# Patient Record
Sex: Female | Born: 1935 | Race: White | Hispanic: No | Smoking: Never smoker
Health system: Southern US, Community
[De-identification: ages and names within clinical notes are randomized; demographics above are authoritative.]

## PROBLEM LIST (undated history)

## (undated) DIAGNOSIS — F329 Major depressive disorder, single episode, unspecified: Secondary | ICD-10-CM

## (undated) DIAGNOSIS — F0390 Unspecified dementia without behavioral disturbance: Secondary | ICD-10-CM

## (undated) DIAGNOSIS — F32A Depression, unspecified: Secondary | ICD-10-CM

## (undated) DIAGNOSIS — A539 Syphilis, unspecified: Secondary | ICD-10-CM

## (undated) HISTORY — PX: OTHER SURGICAL HISTORY: SHX169

---

## 2014-06-22 ENCOUNTER — Other Ambulatory Visit
Admission: RE | Admit: 2014-06-22 | Discharge: 2014-06-22 | Disposition: A | Discharge status: Home / Self Care | Payer: Medicare Other | Source: Ambulatory Visit | Attending: Family Medicine | Admitting: Family Medicine

## 2014-06-22 DIAGNOSIS — Z029 Encounter for administrative examinations, unspecified: Secondary | ICD-10-CM | POA: Insufficient documentation

## 2014-06-22 LAB — URINALYSIS COMPLETE WITH MICROSCOPIC (ARMC ONLY)
Bilirubin Urine: NEGATIVE
GLUCOSE, UA: NEGATIVE mg/dL
Hgb urine dipstick: NEGATIVE
Ketones, ur: NEGATIVE mg/dL
PH: 6.5 (ref 5.0–8.0)
Protein, ur: NEGATIVE mg/dL
Specific Gravity, Urine: 1.025 (ref 1.005–1.030)

## 2014-06-24 LAB — URINE CULTURE: Culture: >100000

## 2014-10-29 ENCOUNTER — Other Ambulatory Visit
Admission: RE | Admit: 2014-10-29 | Discharge: 2014-10-29 | Disposition: A | Discharge status: Home / Self Care | Payer: Medicare Other | Source: Ambulatory Visit | Attending: Nurse Practitioner | Admitting: Nurse Practitioner

## 2014-10-29 DIAGNOSIS — R410 Disorientation, unspecified: Secondary | ICD-10-CM | POA: Insufficient documentation

## 2014-10-29 LAB — URINALYSIS COMPLETE WITH MICROSCOPIC (ARMC ONLY)
Bilirubin Urine: NEGATIVE
Glucose, UA: NEGATIVE mg/dL
Ketones, ur: NEGATIVE mg/dL
NITRITE: NEGATIVE
PH: 6 (ref 5.0–8.0)
PROTEIN: NEGATIVE mg/dL
Specific Gravity, Urine: 1.015 (ref 1.005–1.030)

## 2014-10-31 LAB — URINE CULTURE: Culture: >=100000

## 2015-10-11 ENCOUNTER — Emergency Department
Admission: EM | Admit: 2015-10-11 | Discharge: 2015-10-12 | Disposition: A | Discharge status: Home / Self Care | Payer: Medicare Other | Attending: Emergency Medicine | Admitting: Emergency Medicine

## 2015-10-11 ENCOUNTER — Emergency Department: Payer: Medicare Other

## 2015-10-11 DIAGNOSIS — W19XXXA Unspecified fall, initial encounter: Secondary | ICD-10-CM

## 2015-10-11 DIAGNOSIS — Z043 Encounter for examination and observation following other accident: Secondary | ICD-10-CM | POA: Diagnosis present

## 2015-10-11 DIAGNOSIS — F0390 Unspecified dementia without behavioral disturbance: Secondary | ICD-10-CM | POA: Diagnosis not present

## 2015-10-11 DIAGNOSIS — Y929 Unspecified place or not applicable: Secondary | ICD-10-CM | POA: Diagnosis not present

## 2015-10-11 DIAGNOSIS — Y999 Unspecified external cause status: Secondary | ICD-10-CM | POA: Diagnosis not present

## 2015-10-11 DIAGNOSIS — X58XXXA Exposure to other specified factors, initial encounter: Secondary | ICD-10-CM | POA: Diagnosis not present

## 2015-10-11 DIAGNOSIS — Y939 Activity, unspecified: Secondary | ICD-10-CM | POA: Insufficient documentation

## 2015-10-11 HISTORY — DX: Unspecified dementia, unspecified severity, without behavioral disturbance, psychotic disturbance, mood disturbance, and anxiety: F03.90

## 2015-10-11 HISTORY — DX: Depression, unspecified: F32.A

## 2015-10-11 HISTORY — DX: Major depressive disorder, single episode, unspecified: F32.9

## 2015-10-11 LAB — URINALYSIS COMPLETE WITH MICROSCOPIC (ARMC ONLY)
Bacteria, UA: NONE SEEN
Bilirubin Urine: NEGATIVE
GLUCOSE, UA: NEGATIVE mg/dL
Leukocytes, UA: NEGATIVE
Nitrite: NEGATIVE
Protein, ur: NEGATIVE mg/dL
SPECIFIC GRAVITY, URINE: 1.012 (ref 1.005–1.030)
pH: 6 (ref 5.0–8.0)

## 2015-10-11 LAB — CBC WITH DIFFERENTIAL/PLATELET
BASOS ABS: 0.1 10*3/uL (ref 0–0.1)
Basophils Relative: 1 %
Eosinophils Absolute: 0.2 10*3/uL (ref 0–0.7)
Eosinophils Relative: 2 %
HEMATOCRIT: 36.3 % (ref 35.0–47.0)
HEMOGLOBIN: 12.5 g/dL (ref 12.0–16.0)
Lymphocytes Relative: 25 %
Lymphs Abs: 2.2 10*3/uL (ref 1.0–3.6)
MCH: 29.5 pg (ref 26.0–34.0)
MCHC: 34.3 g/dL (ref 32.0–36.0)
MCV: 86.1 fL (ref 80.0–100.0)
Monocytes Absolute: 0.8 10*3/uL (ref 0.2–0.9)
Monocytes Relative: 10 %
NEUTROS ABS: 5.4 10*3/uL (ref 1.4–6.5)
NEUTROS PCT: 62 %
Platelets: 274 10*3/uL (ref 150–440)
RBC: 4.22 MIL/uL (ref 3.80–5.20)
RDW: 14 % (ref 11.5–14.5)
WBC: 8.7 10*3/uL (ref 3.6–11.0)

## 2015-10-11 LAB — COMPREHENSIVE METABOLIC PANEL
ALK PHOS: 50 U/L (ref 38–126)
ALT: 17 U/L (ref 14–54)
ANION GAP: 7 (ref 5–15)
AST: 24 U/L (ref 15–41)
Albumin: 3.7 g/dL (ref 3.5–5.0)
BILIRUBIN TOTAL: 0.6 mg/dL (ref 0.3–1.2)
BUN: 16 mg/dL (ref 6–20)
CALCIUM: 8.8 mg/dL — AB (ref 8.9–10.3)
CO2: 29 mmol/L (ref 22–32)
Chloride: 102 mmol/L (ref 101–111)
Creatinine, Ser: 0.9 mg/dL (ref 0.44–1.00)
GFR calc non Af Amer: 53 mL/min — ABNORMAL LOW (ref >60)
Glucose, Bld: 106 mg/dL — ABNORMAL HIGH (ref 65–99)
Potassium: 3.6 mmol/L (ref 3.5–5.1)
Sodium: 138 mmol/L (ref 135–145)
TOTAL PROTEIN: 6.7 g/dL (ref 6.5–8.1)

## 2015-10-11 LAB — TROPONIN I: Troponin I: <0.03 ng/mL (ref <0.03)

## 2015-10-11 MED ORDER — SODIUM CHLORIDE 0.9 % IV BOLUS (SEPSIS): 500.0000 mL | Freq: Once | INTRAVENOUS | Status: DC

## 2015-10-11 NOTE — ED Notes (Signed)
EDP notified of being unable to get IV at this time.  Okay to not have IV at this time.

## 2015-10-11 NOTE — ED Provider Notes (Signed)
[facility] Emergency Department Provider Note  ____________________________________________   I have reviewed the triage vital signs and the nursing notes.   HISTORY  Chief Complaint Fall    HPI Marie Vega is a 82 y.o. female who presents today complaining of nothing.According to EMS, patient was found sitting on the ground. Since there was no witnessed fall was not clear how she got there. She is at her baseline otherwise. No further history is available. Level 5 chart caveat; no further history available due to patient status.     No past medical history on file.  There are no active problems to display for this patient.   No past surgical history on file.  Prior to Admission medications   Not on File    Allergies Review of patient's allergies indicates not on file.  No family history on file.  Social History Social History  Substance Use Topics  . Smoking status: Not on file  . Smokeless tobacco: Not on file  . Alcohol use Not on file    Review of Systems Level 5 chart caveat; no further history available due to patient status. ____________________________________________   PHYSICAL EXAM:  VITAL SIGNS: ED Triage Vitals [10/11/15 2145]  Enc Vitals Group     BP      Pulse      Resp      Temp 98.3 F (36.8 C)     Temp Source Oral     SpO2      Weight 105 lb (47.6 kg)     Height 5\' 4"  (1.626 m)     Head Circumference      Peak Flow      Pain Score      Pain Loc      Pain Edu?      Excl. in GC?     Constitutional: Alert and To name, only demented. Well appearing and in no acute distress. Eyes: Conjunctivae are normal. PERRL. EOMI. Head: Atraumatic. Nose: No congestion/rhinnorhea. Mouth/Throat: Mucous membranes are moist.  Oropharynx non-erythematous. Neck: No stridor.   Nontender with no meningismus Cardiovascular: Normal rate, regular rhythm. Grossly normal heart sounds.  Good peripheral circulation. Respiratory:  Normal respiratory effort.  No retractions. Lungs CTAB. Abdominal: Soft and nontender. No distention. No guarding no rebound Back:  There is no focal tenderness or step off.  there is no midline tenderness there are no lesions noted. there is no CVA tenderness Musculoskeletal: No lower extremity tenderness, no upper extremity tenderness. No joint effusions, no DVT signs strong distal pulses no edema Neurologic:  Normal speech and language. No gross focal neurologic deficits are appreciated.  Skin:  Skin is warm, dry and intact. No rash noted. Psychiatric: Mood and affect are normal. Speech and behavior are normal.  ____________________________________________   LABS (all labs ordered are listed, but only abnormal results are displayed)  Labs Reviewed  COMPREHENSIVE METABOLIC PANEL  CBC WITH DIFFERENTIAL/PLATELET  TROPONIN I  URINALYSIS COMPLETEWITH MICROSCOPIC ([facility] ONLY)   ____________________________________________  EKG  I personally interpreted any EKGs ordered by me or triage Sinus rhythm rate 62 bpm no acute ST elevation or acute ST depression borderline RAD, possible old anterior infarct no acute ischemia ____________________________________________  RADIOLOGY  I reviewed any imaging ordered by me or triage that were performed during my shift and, if possible, patient and/or family made aware of any abnormal findings. ____________________________________________   PROCEDURES  Procedure(s) performed: None  Procedures  Critical Care performed: None  ____________________________________________   INITIAL  IMPRESSION / ASSESSMENT AND PLAN / ED COURSE  Pertinent labs & imaging results that were available during my care of the patient were reviewed by me and considered in my medical decision making (see chart for details).  Patient here after possible fall either that or she was just sitting on the floor she can't give a history. No evidence of acute injury but given  that I cannot figure out what happened or she passed out we will do basic blood work urine and CT  Clinical Course   ____________________________________________   FINAL CLINICAL IMPRESSION(S) / ED DIAGNOSES  Final diagnoses:  None      This chart was dictated using voice recognition software.  Despite best efforts to proofread,  errors can occur which can change meaning.      Jeanmarie PlantJames A McShane, MD 10/11/15 2147

## 2015-10-11 NOTE — ED Triage Notes (Signed)
Pt from [facility], was found sitting on the floor by staff with unwitnessed fall.  Per EMS, pt's vitals WNL, pt having no complaints of pain.  Pt confused at baseline.  EMS states that staff reports some increased odd behavior by patient and are concerned for UTI.  EDP notified.

## 2015-10-13 ENCOUNTER — Observation Stay
Admission: EM | Admit: 2015-10-13 | Discharge: 2015-10-14 | Disposition: A | Discharge status: Home / Self Care | Payer: Medicare Other | Attending: Internal Medicine | Admitting: Internal Medicine

## 2015-10-13 ENCOUNTER — Emergency Department: Payer: Medicare Other

## 2015-10-13 ENCOUNTER — Encounter: Payer: Self-pay | Admitting: *Deleted

## 2015-10-13 DIAGNOSIS — F0390 Unspecified dementia without behavioral disturbance: Secondary | ICD-10-CM | POA: Insufficient documentation

## 2015-10-13 DIAGNOSIS — N390 Urinary tract infection, site not specified: Secondary | ICD-10-CM | POA: Diagnosis present

## 2015-10-13 DIAGNOSIS — I700 Atherosclerosis of aorta: Secondary | ICD-10-CM | POA: Insufficient documentation

## 2015-10-13 DIAGNOSIS — G9340 Encephalopathy, unspecified: Secondary | ICD-10-CM | POA: Diagnosis not present

## 2015-10-13 DIAGNOSIS — R531 Weakness: Secondary | ICD-10-CM | POA: Diagnosis not present

## 2015-10-13 DIAGNOSIS — Z8249 Family history of ischemic heart disease and other diseases of the circulatory system: Secondary | ICD-10-CM | POA: Diagnosis not present

## 2015-10-13 DIAGNOSIS — R4182 Altered mental status, unspecified: Secondary | ICD-10-CM | POA: Insufficient documentation

## 2015-10-13 DIAGNOSIS — Z79899 Other long term (current) drug therapy: Secondary | ICD-10-CM | POA: Diagnosis not present

## 2015-10-13 DIAGNOSIS — W19XXXA Unspecified fall, initial encounter: Secondary | ICD-10-CM | POA: Diagnosis not present

## 2015-10-13 DIAGNOSIS — R410 Disorientation, unspecified: Secondary | ICD-10-CM

## 2015-10-13 DIAGNOSIS — F329 Major depressive disorder, single episode, unspecified: Secondary | ICD-10-CM | POA: Diagnosis not present

## 2015-10-13 DIAGNOSIS — Z9842 Cataract extraction status, left eye: Secondary | ICD-10-CM | POA: Insufficient documentation

## 2015-10-13 DIAGNOSIS — I7389 Other specified peripheral vascular diseases: Secondary | ICD-10-CM | POA: Insufficient documentation

## 2015-10-13 DIAGNOSIS — R262 Difficulty in walking, not elsewhere classified: Secondary | ICD-10-CM | POA: Insufficient documentation

## 2015-10-13 DIAGNOSIS — Z7982 Long term (current) use of aspirin: Secondary | ICD-10-CM | POA: Insufficient documentation

## 2015-10-13 LAB — CBC WITH DIFFERENTIAL/PLATELET
BASOS PCT: 0 %
Basophils Absolute: 0 10*3/uL (ref 0–0.1)
EOS ABS: 0 10*3/uL (ref 0–0.7)
Eosinophils Relative: 1 %
HCT: 39.5 % (ref 35.0–47.0)
Hemoglobin: 13.4 g/dL (ref 12.0–16.0)
Lymphocytes Relative: 15 %
Lymphs Abs: 1.1 10*3/uL (ref 1.0–3.6)
MCH: 29.1 pg (ref 26.0–34.0)
MCHC: 33.9 g/dL (ref 32.0–36.0)
MCV: 86 fL (ref 80.0–100.0)
MONO ABS: 0.6 10*3/uL (ref 0.2–0.9)
Monocytes Relative: 8 %
NEUTROS ABS: 5.8 10*3/uL (ref 1.4–6.5)
Neutrophils Relative %: 76 %
Platelets: 247 10*3/uL (ref 150–440)
RBC: 4.59 MIL/uL (ref 3.80–5.20)
RDW: 14.2 % (ref 11.5–14.5)
WBC: 7.6 10*3/uL (ref 3.6–11.0)

## 2015-10-13 LAB — URINALYSIS COMPLETE WITH MICROSCOPIC (ARMC ONLY)
BACTERIA UA: NONE SEEN
Bilirubin Urine: NEGATIVE
Glucose, UA: NEGATIVE mg/dL
LEUKOCYTES UA: NEGATIVE
Nitrite: POSITIVE — AB
PH: 7 (ref 5.0–8.0)
Protein, ur: NEGATIVE mg/dL
Specific Gravity, Urine: 1.011 (ref 1.005–1.030)
Squamous Epithelial / LPF: NONE SEEN

## 2015-10-13 LAB — BASIC METABOLIC PANEL
ANION GAP: 9 (ref 5–15)
BUN: 14 mg/dL (ref 6–20)
CALCIUM: 9 mg/dL (ref 8.9–10.3)
CO2: 30 mmol/L (ref 22–32)
CREATININE: 0.72 mg/dL (ref 0.44–1.00)
Chloride: 101 mmol/L (ref 101–111)
Glucose, Bld: 117 mg/dL — ABNORMAL HIGH (ref 65–99)
Potassium: 3.3 mmol/L — ABNORMAL LOW (ref 3.5–5.1)
SODIUM: 140 mmol/L (ref 135–145)

## 2015-10-13 LAB — TROPONIN I

## 2015-10-13 MED ORDER — ONDANSETRON HCL 4 MG PO TABS: 4.0000 mg | ORAL_TABLET | Freq: Four times a day (QID) | ORAL | Status: DC | PRN

## 2015-10-13 MED ORDER — ASPIRIN 81 MG PO CHEW
CHEWABLE_TABLET | ORAL | Status: AC
  Administered 2015-10-13: 81 mg via ORAL
  Filled 2015-10-13: qty 1

## 2015-10-13 MED ORDER — CEPHALEXIN 500 MG PO CAPS
500.0000 mg | ORAL_CAPSULE | Freq: Once | ORAL | Status: AC
  Administered 2015-10-13: 500 mg via ORAL
  Filled 2015-10-13: qty 1

## 2015-10-13 MED ORDER — OXYCODONE HCL 5 MG PO TABS: 5.0000 mg | ORAL_TABLET | ORAL | Status: DC | PRN

## 2015-10-13 MED ORDER — SERTRALINE HCL 50 MG PO TABS: 50.0000 mg | ORAL_TABLET | Freq: Every day | ORAL | Status: DC

## 2015-10-13 MED ORDER — ACETAMINOPHEN 650 MG RE SUPP: 650.0000 mg | Freq: Four times a day (QID) | RECTAL | Status: DC | PRN

## 2015-10-13 MED ORDER — ACETAMINOPHEN 325 MG PO TABS: 650.0000 mg | ORAL_TABLET | Freq: Four times a day (QID) | ORAL | Status: DC | PRN

## 2015-10-13 MED ORDER — ASPIRIN 81 MG PO CHEW
81.0000 mg | CHEWABLE_TABLET | Freq: Every day | ORAL | Status: DC
  Administered 2015-10-13 – 2015-10-14 (×2): 81 mg via ORAL
  Filled 2015-10-13: qty 1

## 2015-10-13 MED ORDER — ONDANSETRON HCL 4 MG/2ML IJ SOLN: 4.0000 mg | Freq: Four times a day (QID) | INTRAMUSCULAR | Status: DC | PRN

## 2015-10-13 MED ORDER — ENOXAPARIN SODIUM 40 MG/0.4ML ~~LOC~~ SOLN
40.0000 mg | SUBCUTANEOUS | Status: DC
Start: 2015-10-13 — End: 2015-10-14
  Administered 2015-10-13: 40 mg via SUBCUTANEOUS
  Filled 2015-10-13: qty 0.4

## 2015-10-13 MED ORDER — DONEPEZIL HCL 5 MG PO TABS: 10.0000 mg | ORAL_TABLET | Freq: Every day | ORAL | Status: DC

## 2015-10-13 MED ORDER — HYDRALAZINE HCL 20 MG/ML IJ SOLN: 10.0000 mg | INTRAMUSCULAR | Status: DC | PRN

## 2015-10-13 NOTE — ED Provider Notes (Signed)
[facility] Emergency Department Provider Note  ____________________________________________  Time seen: Approximately 12:19 PM  I have reviewed the triage vital signs and the nursing notes.   HISTORY  Chief Complaint Altered Mental Status  Level 5 caveat:  Portions of the history and physical were unable to be obtained due to dementia and AMS   HPI Marie Vega is a 82 y.o. female a history of dementia and depression who presents for evaluation of altered mental status. Patient was seen here 2 days ago status post mechanical fall. At that time had labs, urine, CT head and cervical spine with no acute findings and was sent back to her skilled nursing facility. Today she was noticed to be sitting down and less responsive, she was confused which is not her baseline per staff.Staff reports the patient is usually very active and walks around the facility. They also noted she had slurred speech and he was leaning to the right which prompted them to send patient to the emergency room. Patient is very confused at this time and is alert and oriented to self only. She denies headache, chest pain, shortness of breath, abdominal pain. She is moving all 4 extremities. Her face is symmetric. Her vital signs are within normal limits.  Past Medical History:  Diagnosis Date  . Dementia   . Depression     Patient Active Problem List   Diagnosis Date Noted  . Acute encephalopathy 10/13/2015    History reviewed. No pertinent surgical history.  Prior to Admission medications   Medication Sig Start Date End Date Taking? Authorizing Provider  acetaminophen (TYLENOL) 500 MG tablet Take 500 mg by mouth at bedtime as needed.   Yes Historical Provider, MD  aspirin 81 MG chewable tablet Chew 81 mg by mouth daily.   Yes Historical Provider, MD  donepezil (ARICEPT) 10 MG tablet Take 10 mg by mouth at bedtime.   Yes Historical Provider, MD  Melatonin 3 MG TABS Take 3 mg by mouth at  bedtime.   Yes Historical Provider, MD  sertraline (ZOLOFT) 50 MG tablet Take 50 mg by mouth at bedtime.   Yes Historical Provider, MD    Allergies Review of patient's allergies indicates no known allergies.  Family History  Problem Relation Age of Onset  . Hypertension Other     Social History Social History  Substance Use Topics  . Smoking status: Never Smoker  . Smokeless tobacco: Never Used  . Alcohol use Not on file    Review of Systems  Constitutional: Negative for fever. +confusion Eyes: Negative for visual changes. ENT: Negative for sore throat. Cardiovascular: Negative for chest pain. Respiratory: Negative for shortness of breath. Gastrointestinal: Negative for abdominal pain, vomiting or diarrhea. Genitourinary: Negative for dysuria. Musculoskeletal: Negative for back pain. Skin: Negative for rash. Neurological: Negative for headaches, weakness or numbness.  ____________________________________________   PHYSICAL EXAM:  VITAL SIGNS: ED Triage Vitals [10/13/15 1202]  Enc Vitals Group     BP (!) 163/68     Pulse Rate 64     Resp 17     Temp 97.8 F (36.6 C)     Temp Source Oral     SpO2 98 %     Weight 105 lb (47.6 kg)     Height 5\' 4"  (1.626 m)     Head Circumference      Peak Flow      Pain Score      Pain Loc      Pain Edu?  Excl. in GC?     Constitutional: Alert and oriented x 1 only, slow to answer questions, no distress.  HEENT:      Head: Normocephalic and atraumatic.         Eyes: Conjunctivae are normal. Sclera is non-icteric. EOMI. PERRL      Mouth/Throat: Mucous membranes are dry.       Neck: Supple with no signs of meningismus. Cardiovascular: Regular rate and rhythm. No murmurs, gallops, or rubs. 2+ symmetrical distal pulses are present in all extremities. No JVD. Respiratory: Normal respiratory effort. Lungs are clear to auscultation bilaterally. No wheezes, crackles, or rhonchi.  Gastrointestinal: Soft, non tender, and non  distended with positive bowel sounds. No rebound or guarding. Genitourinary: No CVA tenderness. Musculoskeletal: Nontender with normal range of motion in all extremities. No edema, cyanosis, or erythema of extremities. Neurologic: Normal speech and language. Face is symmetric. Moving all extremities. No gross focal neurologic deficits are appreciated. Skin: Skin is warm, dry and intact. No rash noted. Psychiatric: Mood and affect are normal. Speech and behavior are normal.  ____________________________________________   LABS (all labs ordered are listed, but only abnormal results are displayed)  Labs Reviewed  URINALYSIS COMPLETEWITH MICROSCOPIC ([facility] ONLY) - Abnormal; Notable for the following:       Result Value   Color, Urine YELLOW (*)    APPearance HAZY (*)    Ketones, ur TRACE (*)    Hgb urine dipstick 1+ (*)    Nitrite POSITIVE (*)    All other components within normal limits  BASIC METABOLIC PANEL - Abnormal; Notable for the following:    Potassium 3.3 (*)    Glucose, Bld 117 (*)    All other components within normal limits  URINE CULTURE  CBC WITH DIFFERENTIAL/PLATELET  TROPONIN I   ____________________________________________  EKG  ED ECG REPORT I, Nita Sicklearolina Opie Fanton, the attending physician, personally viewed and interpreted this ECG.  Normal sinus rhythm, rate of 61, normal intervals, normal axis, no ST elevations or depressions. ____________________________________________  RADIOLOGY  Head CT: negative  CXR: negative  ____________________________________________   PROCEDURES  Procedure(s) performed: None Procedures Critical Care performed:  None ____________________________________________   INITIAL IMPRESSION / ASSESSMENT AND PLAN / ED COURSE  82 y.o. female a history of dementia and depression who presents for evaluation of altered mental status. Patient status post mechanical fall 2 days ago with essentially negative workup including imaging,  urine, and labs. Today was found to be less active, confused, slurring her speech, leaning to the right. Patient here is alert and oriented 1 and slowly answering to questions. She does not know where she is. She denies any complaints of pain at this time. She is grossly neurologically intact and is moving all 4 extremities with the face is symmetric, extraocular movements are intact. We'll check a repeat head CT, labs and urine.  Clinical Course  Comment By Time  UA pending. Patient remains altered. Labs, CT head, and CXR negative. Plan to admit for delirium. Care transferred to Dr. York CeriseForbach Nita Sicklearolina Loraina Stauffer, MD 08/23 1623    Pertinent labs & imaging results that were available during my care of the patient were reviewed by me and considered in my medical decision making (see chart for details).    ____________________________________________   FINAL CLINICAL IMPRESSION(S) / ED DIAGNOSES  Final diagnoses:  Delirium  UTI (lower urinary tract infection)      NEW MEDICATIONS STARTED DURING THIS VISIT:  Current Discharge Medication List  Note:  This document was prepared using Dragon voice recognition software and may include unintentional dictation errors.    Nita Sickle, MD 10/14/15 1026

## 2015-10-13 NOTE — H&P (Signed)
[employer] -  at [facility]   PATIENT NAME: Marie Vega    MR#:  [id number]  DATE OF BIRTH:  [birth year]   DATE OF ADMISSION:  10/13/2015  PRIMARY CARE PHYSICIAN: Randie Heinz, NP   REQUESTING/REFERRING PHYSICIAN: York Cerise  CHIEF COMPLAINT:   Chief Complaint  Patient presents with  . Altered Mental Status    HISTORY OF PRESENT ILLNESS:  Marie Vega  is a 82 y.o. female with a known history of Dementia who is presenting from her nursing facility with altered mental status. Patient unable to provide meaningful information history obtained from her department staff. Apparently she was more confused and sleepy this morning thus sent to Hospital further workup and evaluation. Of note recently evaluated hospital 2 days ago for the similar thing. Patient unable to provide meaningful information given mental status  PAST MEDICAL HISTORY:   Past Medical History:  Diagnosis Date  . Dementia   . Depression     PAST SURGICAL HISTORY:  History reviewed. No pertinent surgical history.  SOCIAL HISTORY:   Social History  Substance Use Topics  . Smoking status: Never Smoker  . Smokeless tobacco: Never Used  . Alcohol use Not on file    FAMILY HISTORY:   Family History  Problem Relation Age of Onset  . Hypertension Other     DRUG ALLERGIES:  No Known Allergies  REVIEW OF SYSTEMS:  REVIEW OF SYSTEMS: Pleasantly confused CONSTITUTIONAL: Denies fevers, chills, fatigue, weakness.  EYES: Denies blurred vision, double vision, or eye pain.  EARS, NOSE, THROAT: Denies tinnitus, ear pain, hearing loss.  RESPIRATORY: denies cough, shortness of breath, wheezing  CARDIOVASCULAR: Denies chest pain, palpitations, edema.  GASTROINTESTINAL: Denies nausea, vomiting, diarrhea, abdominal pain.  GENITOURINARY: Denies dysuria, hematuria.  ENDOCRINE: Denies nocturia or thyroid problems. HEMATOLOGIC AND LYMPHATIC: Denies easy bruising or bleeding.  SKIN: Denies  rash or lesions.  MUSCULOSKELETAL: Denies pain in neck, back, shoulder, knees, hips, or further arthritic symptoms.  NEUROLOGIC: Denies paralysis, paresthesias.  PSYCHIATRIC: Denies anxiety or depressive symptoms. Otherwise full review of systems performed by me is negative.   MEDICATIONS AT HOME:   Prior to Admission medications   Medication Sig Start Date End Date Taking? Authorizing Provider  acetaminophen (TYLENOL) 500 MG tablet Take 500 mg by mouth at bedtime as needed.   Yes Historical Provider, MD  aspirin 81 MG chewable tablet Chew 81 mg by mouth daily.   Yes Historical Provider, MD  donepezil (ARICEPT) 10 MG tablet Take 10 mg by mouth at bedtime.   Yes Historical Provider, MD  Melatonin 3 MG TABS Take 3 mg by mouth at bedtime.   Yes Historical Provider, MD  sertraline (ZOLOFT) 50 MG tablet Take 50 mg by mouth at bedtime.   Yes Historical Provider, MD      VITAL SIGNS:  Blood pressure (!) 172/60, pulse 93, temperature 97.8 F (36.6 C), temperature source Oral, resp. rate 16, height 5\' 4"  (1.626 m), weight 47.6 kg (105 lb), SpO2 96 %.  PHYSICAL EXAMINATION:  VITAL SIGNS: Vitals:   10/13/15 1202 10/13/15 1802  BP: (!) 163/68 (!) 172/60  Pulse: 64 93  Resp: 17 16  Temp: 97.8 F (36.6 C)    GENERAL:82 y.o.female currently in no acute distress. Pleasantly confused HEAD: Normocephalic, atraumatic.  EYES: Pupils equal, round, reactive to light. Extraocular muscles intact. No scleral icterus.  MOUTH: Moist mucosal membrane. Dentition intact. No abscess noted.  EAR, NOSE, THROAT: Clear without exudates. No external lesions.  NECK: Supple.  No thyromegaly. No nodules. No JVD.  PULMONARY: Clear to ascultation, without wheeze rails or rhonci. No use of accessory muscles, Good respiratory effort. good air entry bilaterally CHEST: Nontender to palpation.  CARDIOVASCULAR: S1 and S2. Regular rate and rhythm. No murmurs, rubs, or gallops. No edema. Pedal pulses 2+ bilaterally.    GASTROINTESTINAL: Soft, nontender, nondistended. No masses. Positive bowel sounds. No hepatosplenomegaly.  MUSCULOSKELETAL: No swelling, clubbing, or edema. Range of motion full in all extremities.  NEUROLOGIC: Cranial nerves II through XII are intact. No gross focal neurological deficits. Sensation intact. Reflexes intact.  SKIN: No ulceration, lesions, rashes, or cyanosis. Skin warm and dry. Turgor intact.  PSYCHIATRIC: Mood, affect within normal limits. The patient is awake, alert and oriented x self and location. Insight, judgment poor.    LABORATORY PANEL:   CBC  Recent Labs Lab 10/13/15 1236  WBC 7.6  HGB 13.4  HCT 39.5  PLT 247   ------------------------------------------------------------------------------------------------------------------  Chemistries   Recent Labs Lab 10/11/15 2137 10/13/15 1236  NA 138 140  K 3.6 3.3*  CL 102 101  CO2 29 30  GLUCOSE 106* 117*  BUN 16 14  CREATININE 0.90 0.72  CALCIUM 8.8* 9.0  AST 24  --   ALT 17  --   ALKPHOS 50  --   BILITOT 0.6  --    ------------------------------------------------------------------------------------------------------------------  Cardiac Enzymes  Recent Labs Lab 10/13/15 1236  TROPONINI <0.03   ------------------------------------------------------------------------------------------------------------------  RADIOLOGY:  Dg Chest 2 View  Result Date: 10/13/2015 CLINICAL DATA:  Altered mental status EXAM: CHEST  2 VIEW COMPARISON:  None. FINDINGS: There is no edema or consolidation. The heart size and pulmonary vascularity are normal. No adenopathy. There is atherosclerotic calcification in the aortic arch. There is degenerative change in the thoracic spine. IMPRESSION: Aortic atherosclerosis.  No edema or consolidation. Electronically Signed   By: Bretta BangWilliam  Woodruff III M.D.   On: 10/13/2015 13:40   Ct Head Wo Contrast  Result Date: 10/13/2015 CLINICAL DATA:  Slurred speech and gait  disturbance.  Confusion. EXAM: CT HEAD WITHOUT CONTRAST TECHNIQUE: Contiguous axial images were obtained from the base of the skull through the vertex without intravenous contrast. COMPARISON:  October 11, 2015 FINDINGS: Brain: Moderate diffuse atrophy is stable. There is no intracranial mass, hemorrhage, extra-axial fluid collection, or midline shift. There is patchy small vessel disease throughout the centra semiovale bilaterally, stable. No new gray-white compartment lesion. No acute infarct evident. Vascular: There are no hyperdense vessels. There is calcification in each carotid siphon region. Skull: Bony calvarium appears intact. Sinuses/Orbits: There is opacification in the inferior right frontal sinus region. There is opacification of a posterior right ethmoid air cell. Visualized orbits appear symmetric bilaterally. Other: Mastoid air cells are clear. IMPRESSION: Atrophy with periventricular small vessel disease, stable. No intracranial mass, hemorrhage, or acute appearing infarct. There are areas of paranasal sinus disease. There are foci of arterial vascular calcification. Electronically Signed   By: Bretta BangWilliam  Woodruff III M.D.   On: 10/13/2015 14:10   Ct Head Wo Contrast  Result Date: 10/11/2015 CLINICAL DATA:  Dementia patient post unwitnessed fall. Found sitting on floor. EXAM: CT HEAD WITHOUT CONTRAST CT CERVICAL SPINE WITHOUT CONTRAST TECHNIQUE: Multidetector CT imaging of the head and cervical spine was performed following the standard protocol without intravenous contrast. Multiplanar CT image reconstructions of the cervical spine were also generated. COMPARISON:  None. FINDINGS: CT HEAD FINDINGS Brain: Generalized atrophy and chronic small vessel ischemia.No intracranial hemorrhage, mass effect, or midline shift. No hydrocephalus. The  basilar cisterns are patent. No evidence of territorial infarct. No intracranial fluid collection. Vascular: No hyperdense vessel or abnormal calcification.  Atherosclerosis of skullbase vasculature. Skull: No acute fracture.  Calvarium is intact. Sinuses/Orbits: Remote appearing left nasal bone fracture without associated soft tissue edema. Included paranasal sinuses and mastoid air cells are well aerated. Cataract resection on the left. CT CERVICAL SPINE FINDINGS No fracture or acute subluxation. The dens is intact. There are no jumped or perched facets. Vertebral body heights are maintained. Disc space narrowing throughout most prominent at C5-C6 with associated endplate spurring. There is scattered facet arthropathy. Minimal anterolisthesis of C7 on T1 appears degenerative. No prevertebral soft tissue edema. Heterogeneous appearance of the thyroid gland. Pleural-parenchymal scarring at the lung apices. IMPRESSION: 1. No acute intracranial abnormality. Generalized atrophy and chronic small vessel ischemia. 2. Degenerative change in the cervical spine without acute fracture or subluxation. Electronically Signed   By: Rubye OaksMelanie  Ehinger M.D.   On: 10/11/2015 22:14   Ct Cervical Spine Wo Contrast  Result Date: 10/11/2015 CLINICAL DATA:  Dementia patient post unwitnessed fall. Found sitting on floor. EXAM: CT HEAD WITHOUT CONTRAST CT CERVICAL SPINE WITHOUT CONTRAST TECHNIQUE: Multidetector CT imaging of the head and cervical spine was performed following the standard protocol without intravenous contrast. Multiplanar CT image reconstructions of the cervical spine were also generated. COMPARISON:  None. FINDINGS: CT HEAD FINDINGS Brain: Generalized atrophy and chronic small vessel ischemia.No intracranial hemorrhage, mass effect, or midline shift. No hydrocephalus. The basilar cisterns are patent. No evidence of territorial infarct. No intracranial fluid collection. Vascular: No hyperdense vessel or abnormal calcification. Atherosclerosis of skullbase vasculature. Skull: No acute fracture.  Calvarium is intact. Sinuses/Orbits: Remote appearing left nasal bone fracture  without associated soft tissue edema. Included paranasal sinuses and mastoid air cells are well aerated. Cataract resection on the left. CT CERVICAL SPINE FINDINGS No fracture or acute subluxation. The dens is intact. There are no jumped or perched facets. Vertebral body heights are maintained. Disc space narrowing throughout most prominent at C5-C6 with associated endplate spurring. There is scattered facet arthropathy. Minimal anterolisthesis of C7 on T1 appears degenerative. No prevertebral soft tissue edema. Heterogeneous appearance of the thyroid gland. Pleural-parenchymal scarring at the lung apices. IMPRESSION: 1. No acute intracranial abnormality. Generalized atrophy and chronic small vessel ischemia. 2. Degenerative change in the cervical spine without acute fracture or subluxation. Electronically Signed   By: Rubye OaksMelanie  Ehinger M.D.   On: 10/11/2015 22:14    EKG:   Orders placed or performed during the hospital encounter of 10/13/15  . ED EKG  . ED EKG  . EKG 12-Lead  . EKG 12-Lead    IMPRESSION AND PLAN:   82 year old Caucasian female history of dementia presenting with worsening mental status  1. Encephalopathy: I actually question how far from baseline she is at this time, minimize sedating agents no indication for antibiotics at this time 2. Dementia Aricept    All the records are reviewed and case discussed with ED provider. Management plans discussed with the patient, family and they are in agreement.  CODE STATUS: Full  TOTAL TIME TAKING CARE OF THIS PATIENT: 33 minutes.    Hower,  Mardi MainlandDavid K M.D on 10/13/2015 at 7:32 PM  Between 7am to 6pm - Pager - [phone]  After 6pm: House Pager: [phone]  [facility]  Office  [phone]  CC: Primary care physician; Randie HeinzAnne Marie Mukamana, NP

## 2015-10-13 NOTE — ED Triage Notes (Signed)
Pt arrived to ED from [facility] via EMS after facility reports pt was slurring speech and leaning to the right. Pt was reportedly sitting this morning which is not normal for her. Per EMS pt is usually walking around and active. Pt was seen in ED 24 hours ago for a fall. Pt is confused and not responding to nurses questions at this time. Unknown baseline at this time.

## 2015-10-13 NOTE — ED Provider Notes (Signed)
-----------------------------------------   4:37 PM on 10/13/2015 -----------------------------------------   Blood pressure (!) 163/68, pulse 64, temperature 97.8 F (36.6 C), temperature source Oral, resp. rate 17, height 5\' 4"  (1.626 m), weight 47.6 kg, SpO2 98 %.  Assuming care from Dr. Don PerkingVeronese.  In short, Marie Vega is a 82 y.o. female with a chief complaint of Altered Mental Status .  Refer to the original H&P for additional details.  The current plan of care is to follow up urinalysis and admit for delirium.    ----------------------------------------- 6:55 PM on 10/13/2015 -----------------------------------------  Urine is nitrite positive but otherwise unremarkable.  Patient remains altered with signs and symptoms most consistent with delirium of unknown origin. Hospitalist will admit.   Loleta Roseory Jim Philemon, MD 10/13/15 [phone]

## 2015-10-14 DIAGNOSIS — G9340 Encephalopathy, unspecified: Secondary | ICD-10-CM | POA: Diagnosis not present

## 2015-10-14 LAB — MRSA PCR SCREENING: MRSA by PCR: NEGATIVE

## 2015-10-14 MED ORDER — ENSURE ENLIVE PO LIQD: 237.0000 mL | Freq: Three times a day (TID) | ORAL | 12 refills | Status: AC

## 2015-10-14 MED ORDER — POTASSIUM CHLORIDE 20 MEQ PO PACK
40.0000 meq | PACK | Freq: Once | ORAL | Status: AC
  Administered 2015-10-14: 40 meq via ORAL
  Filled 2015-10-14: qty 2

## 2015-10-14 MED ORDER — ENSURE ENLIVE PO LIQD
237.0000 mL | Freq: Three times a day (TID) | ORAL | Status: DC
  Administered 2015-10-14: 237 mL via ORAL

## 2015-10-14 NOTE — Progress Notes (Signed)
Initial Nutrition Assessment  DOCUMENTATION CODES:   Severe malnutrition in context of chronic illness  INTERVENTION:  -Ensure Enlive po BID, each supplement provides 350 kcal and 20 grams of protein -RD to continue monitor  NUTRITION DIAGNOSIS:   Malnutrition related to chronic illness as evidenced by severe depletion of muscle mass, moderate depletion of body fat.  GOAL:   Patient will meet greater than or equal to 90% of their needs  MONITOR:   PO intake, I & O's, Labs, Weight trends, Supplement acceptance  ASSESSMENT:   Marie PellegriniMary Vega  is a 82 y.o. female with a known history of Dementia who is presenting from her nursing facility with altered mental status. Patient unable to provide meaningful information history obtained from her department staff. Apparently she was more confused and sleepy this morning thus sent to Hospital further workup and evaluation. Of note recently evaluated hospital 2 days ago for the similar thing.  Attempted to speak with Marie Vega at bedside but she is a poor historian who remembers very little. She does state her PO intake PTA and Appetite have been poor. States a normal wt of 109# otherwise, she did not provide much information. Per sitter, Nurse Tech, she did not eat any breakfast.  Nutrition-Focused physical exam completed. Findings are moderate fat depletion, severe muscle depletion, and no edema.   Labs and medications reviewed  Diet Order:  Diet Heart Room service appropriate? Yes; Fluid consistency: Thin  Skin:  Reviewed, no issues  Last BM:  PTA  Height:   Ht Readings from Last 1 Encounters:  10/13/15 5\' 4"  (1.626 m)    Weight:   Wt Readings from Last 1 Encounters:  10/13/15 105 lb (47.6 kg)    Ideal Body Weight:  54.54 kg  BMI:  Body mass index is 18.02 kg/m.  Estimated Nutritional Needs:   Kcal:  1200-1400 calories  Protein:  47-60 grams  Fluid:  >/= 1.2L  EDUCATION NEEDS:   No education needs identified at this  time  Dionne AnoWilliam M. Daruis Swaim, MS, RD LDN Inpatient Clinical Dietitian Pager [phone]

## 2015-10-14 NOTE — Care Management Obs Status (Signed)
MEDICARE OBSERVATION STATUS NOTIFICATION   Patient Details  Name: Marie Vega MRN: [id number] Date of Birth: 04/29/1919   Medicare Observation Status Notification Given:  Yes    Marie HugueninBerkhead, Rilei Kravitz L, RN 10/14/2015, 2:18 PM

## 2015-10-14 NOTE — Evaluation (Signed)
Physical Therapy Evaluation Patient Details Name: Marie Vega MRN: [id number] DOB: 1919/10/30 Today's Date: 10/14/2015   History of Present Illness  Marie Vega is a 82 y/o female who is being held for observation due to a change in mental status noticed on 8/23 when the pt has slurred speech and was leaning to her right. Her CT was neg for stroke; pt was at the ED 24h before this occation on 8/21 for an unobserved fall with odd behavior. The pt is confused at her baseline with a Hx of dementia  Clinical Impression  Marie Vega is a pleasantly confused 82 y/o female. She presents with general weakness, poor safety awareness, and impaired balance. She is oriented to self and birthday, but not time or place. She is independent with bed mobility, but requires increased time/ effort; with initial sit-to-stand pt required mod assist due to feet being too far foward with inconsistent response to cues and poor balance; in standing pt continued to have a moderate posterior lean. Pt initially had a moderate posterior lean witout AD requiring assist X 2 to keep from falling; within 1-107minutes pt was given RW and was able to stand upright w/o posterior lean; she received gait training X 60' with RW and min assist X 1. At her baseline pt is independent with functional mobility without AD. Pt is appropriate for skilled PT at this time to address deficits in strength, balance, coordination, endurance, gait, safety awareness, and safe use of DME. This entire session was guided, instructed, and directly supervised by Elizabeth Palau, DPT.      Follow Up Recommendations Home health PT    Equipment Recommendations  Rolling walker with 5" wheels (unknown if pt has RW already)    Recommendations for Other Services       Precautions / Restrictions Precautions Precautions: Fall Precaution Comments: Strong posterior lean, better with RW      Mobility  Bed Mobility Overal bed mobility: Needs Assistance Bed Mobility:  Supine to Sit;Sit to Supine     Supine to sit: Supervision Sit to supine: Supervision   General bed mobility comments: Increased time and effort, with inconsistant safety awareness.   Transfers Overall transfer level: Needs assistance Equipment used: None Transfers: Sit to/from Stand Sit to Stand: Mod assist         General transfer comment: Increased time and effort, with inconsistant safety awareness. With initial sit-to-stand pt required mod assist due to feet being too far foward with poor balance; in standing pt continued to have a moderate posterior lean  Ambulation/Gait Ambulation/Gait assistance: Min assist Ambulation Distance (Feet): 60 Feet Assistive device: Rolling walker (2 wheeled) Gait Pattern/deviations: Shuffle;Drifts right/left   Gait velocity interpretation: <1.8 ft/sec, indicative of risk for recurrent falls General Gait Details: Pt initially had a moderate posterior lean witout AD requiring assist X 2 to keep from falling; within 1-29minutes pt was given RW and was able to stand upright w/o posterior lean; she received gait training X 60' with min assist  Stairs            Wheelchair Mobility    Modified Rankin (Stroke Patients Only)       Balance Overall balance assessment: Needs assistance Sitting-balance support: No upper extremity supported;Feet supported Sitting balance-Leahy Scale: Good Sitting balance - Comments: pt can tolerate minor perturbation Postural control: Posterior lean (resolved within minutes with cuing and RW) Standing balance support: Bilateral upper extremity supported Standing balance-Leahy Scale: Fair Standing balance comment: balance was fair once pt resolved posterior  lean (1-2 minutes after standing)                             Pertinent Vitals/Pain Pain Assessment: No/denies pain    Home Living Family/patient expects to be discharged to:: Assisted living               Home Equipment:   (Unknown)      Prior Function Level of Independence: Needs assistance   Gait / Transfers Assistance Needed: Pt was independent without AD  ADL's / Homemaking Assistance Needed: Pt is too confused to safely care for herself, but exact level of care is unknown        Hand Dominance        Extremity/Trunk Assessment   Upper Extremity Assessment: Generalized weakness (4-/5 with gross MMT)           Lower Extremity Assessment: Generalized weakness (4-/5 with gross MMT)      Cervical / Trunk Assessment: Kyphotic  Communication   Communication: No difficulties  Cognition Arousal/Alertness: Awake/alert Behavior During Therapy: WFL for tasks assessed/performed Overall Cognitive Status: History of cognitive impairments - at baseline       Memory: Decreased short-term memory              General Comments      Exercises Other Exercises Other Exercises: Gait training with RW after pt stood with moderate posterior lean requiring mod assist X 2. Posterior lean was resolved with verbal and tactile cuing and use of RW. Gait training included cuing for posture, hand and foot placement, and gait mechanics.      Assessment/Plan    PT Assessment Patient needs continued PT services  PT Diagnosis Difficulty walking;Abnormality of gait;Generalized weakness   PT Problem List Decreased strength;Decreased range of motion;Decreased activity tolerance;Decreased mobility;Decreased balance;Decreased coordination;Decreased cognition;Decreased knowledge of use of DME;Decreased safety awareness;Decreased knowledge of precautions;Cardiopulmonary status limiting activity  PT Treatment Interventions DME instruction;Gait training;Stair training;Functional mobility training;Therapeutic activities;Therapeutic exercise;Balance training;Neuromuscular re-education;Cognitive remediation   PT Goals (Current goals can be found in the Care Plan section) Acute Rehab PT Goals Patient Stated Goal: none  stated PT Goal Formulation: With patient Time For Goal Achievement: 10/28/15 Potential to Achieve Goals: Fair    Frequency Min 2X/week   Barriers to discharge        Co-evaluation               End of Session Equipment Utilized During Treatment: Gait belt Activity Tolerance: Patient tolerated treatment well Patient left: in bed;with call bell/phone within reach;with bed alarm set Nurse Communication: Mobility status         Time: 2956-21301402-1428 PT Time Calculation (min) (ACUTE ONLY): 26 min   Charges:         PT G Codes:        Graden Hoshino 10/14/2015, 3:33 PM  Cassell Smilesevan M Ilham Roughton, SPT [phone]

## 2015-10-14 NOTE — [place] FL2 (Signed)
[place] MEDICAID FL2 LEVEL OF CARE SCREENING TOOL     IDENTIFICATION  Patient Name: Marie PellegriniMary Vega Birthdate: 03/25/1919 Sex: female Admission Date (Current Location): 10/13/2015  [place]   Facility and Address:  [facility], [address], [place], [place] [address]      Provider Number: [id number]  Attending Physician Name and Address:  Altamese DillingVaibhavkumar Rehana Uncapher, MD  Relative Name and Phone Number:       Current Level of Care: Domiciliary (Rest home) Recommended Level of Care: Memory Care, Assisted Living Facility Prior Approval Number:    Date Approved/Denied:   PASRR Number:    Discharge Plan: Domiciliary (Rest home)    Current Diagnoses: Primary: Dementia  Patient Active Problem List   Diagnosis Date Noted  . Acute encephalopathy 10/13/2015  Depression  Altered Mental Status     Orientation RESPIRATION BLADDER Height & Weight     Self  Normal Incontinent Weight: 105 lb (47.6 kg) Height:  5\' 4"  (162.6 cm)  BEHAVIORAL SYMPTOMS/MOOD NEUROLOGICAL BOWEL NUTRITION STATUS   (none)  (none ) Continent Diet (Diet: Heart Healthy )  AMBULATORY STATUS COMMUNICATION OF NEEDS Skin   Supervision Verbally Normal                       Personal Care Assistance Level of Assistance  Bathing, Feeding, Dressing Bathing Assistance: Limited assistance Feeding assistance: Independent Dressing Assistance: Limited assistance     Functional Limitations Info  Sight, Hearing, Speech Sight Info: Adequate Hearing Info: Adequate Speech Info: Adequate    SPECIAL CARE FACTORS FREQUENCY  PT (By licensed PT)     PT Frequency:  (home health PT 2-3 days per week. )              Contractures      Additional Factors Info  Code Status, Allergies Code Status Info:  (Full Code. ) Allergies Info:  (No Known Allergies. )           Current Medications (10/14/2015):  This is the current hospital active medication  list Current Facility-Administered Medications  Medication Dose Route Frequency Provider Last Rate Last Dose  . acetaminophen (TYLENOL) tablet 650 mg  650 mg Oral Q6H PRN Wyatt Hasteavid K Hower, MD       Or  . acetaminophen (TYLENOL) suppository 650 mg  650 mg Rectal Q6H PRN Wyatt Hasteavid K Hower, MD      . aspirin chewable tablet 81 mg  81 mg Oral Daily Wyatt Hasteavid K Hower, MD   81 mg at 10/13/15 2109  . donepezil (ARICEPT) tablet 10 mg  10 mg Oral QHS Wyatt Hasteavid K Hower, MD      . enoxaparin (LOVENOX) injection 40 mg  40 mg Subcutaneous Q24H Wyatt Hasteavid K Hower, MD   40 mg at 10/13/15 2231  . hydrALAZINE (APRESOLINE) injection 10 mg  10 mg Intravenous Q4H PRN Wyatt Hasteavid K Hower, MD      . ondansetron [facility](ZOFRAN) tablet 4 mg  4 mg Oral Q6H PRN Wyatt Hasteavid K Hower, MD       Or  . ondansetron [facility](ZOFRAN) injection 4 mg  4 mg Intravenous Q6H PRN Wyatt Hasteavid K Hower, MD      . oxyCODONE (Oxy IR/ROXICODONE) immediate release tablet 5 mg  5 mg Oral Q4H PRN Wyatt Hasteavid K Hower, MD      . sertraline (ZOLOFT) tablet 50 mg  50 mg Oral QHS Wyatt Hasteavid K Hower, MD         Discharge Medications: Please  see discharge summary for a list of discharge medications.  Relevant Imaging Results:  Relevant Lab Results:   Additional Information  (SSN: [id number])  Sample, Darleen CrockerBailey M, LCSW

## 2015-10-14 NOTE — Clinical Social Work Note (Signed)
Clinical Social Work Assessment  Patient Details  Name: Marie PellegriniMary Huffaker MRN: [id number] Date of Birth: 09/16/1919  Date of referral:  10/14/15               Reason for consult:  Other (Comment Required) (From [facility] )                Permission sought to share information with:  Ocean[place]ility Contact Representative Permission granted to share information::  Yes, Verbal Permission Granted  Name::      [facility]   Agency::     Relationship::     Contact Information:     Housing/Transportation Living arrangements for the past 2 months:  Assisted Living Facility Source of Information:  Facility, Adult Children, Power of Attorney Patient Interpreter Needed:  None Criminal Activity/Legal Involvement Pertinent to Current Situation/Hospitalization:  No - Comment as needed Significant Relationships:  Adult Children Lives with:  Facility Resident Do you feel safe going back to the place where you live?    Need for family participation in patient care:  Yes (Comment)  Care giving concerns:  Patient is a long term care resident at [facility] (fax: [phone])   Social Worker assessment / plan:  Clinical Social Worker (CSW) reviewed chart and noted that patient is from [facility]. CSW contacted [facility] and spoke to [employer]/ Supervisor Jennifer. Per Victorino DikeJennifer patient is a resident in the memory care unit and at baseline walks independently without an assistive device. Per Victorino DikeJennifer patient's son Jomarie LongsJoseph is the main contact. Per Victorino DikeJennifer patient can return and they have in house home health if she needs it. CSW also contacted patient's son Jomarie LongsJoseph to complete assessment because patient is not alert and oriented. Per son patient has lived at [facility] since November 2016 and is private pay. Per son he is HPOA and will transport patient back to [facility] when she is ready for D/C. CSW explained to son that patient is under  Medicare Observation, which means that Medicare will not pay for a SNF. Son verbalized his understanding and is agreeable to patient returning to [facility].   FL2 complete. CSW requested PT consult to determine if patient is at her baseline. CSW will continue to follow and assist as needed.   Employment status:  Retired Health and safety inspectornsurance information:  Medicare PT Recommendations:  Not assessed at this time Information / Referral to community resources:  Other (Comment Required) (Plan is to return to ALF )  Patient/Family's Response to care:  Patient's son is agreeable for patient to return to [facility].   Patient/Family's Understanding of and Emotional Response to Diagnosis, Current Treatment, and Prognosis:  Patient's son was very pleasant and thanked CSW for assistance.   Emotional Assessment Appearance:  Appears stated age Attitude/Demeanor/Rapport:  Unable to Assess Affect (typically observed):  Unable to Assess Orientation:  Oriented to Self, Fluctuating Orientation (Suspected and/or reported Sundowners) Alcohol / Substance use:  Not Applicable Psych involvement (Current and /or in the community):  No (Comment)  Discharge Needs  Concerns to be addressed:  Discharge Planning Concerns Readmission within the last 30 days:  No Current discharge risk:  Cognitively Impaired, Chronically ill Barriers to Discharge:  Continued Medical Work up   [employer], Darleen CrockerBailey M, LCSW 10/14/2015, 9:52 AM

## 2015-10-14 NOTE — Discharge Summary (Signed)
[facility] -  at [facility]   PATIENT NAME: Marie PellegriniMary Vega    MR#:  [id number]  DATE OF BIRTH:  10/10/1919  DATE OF ADMISSION:  10/13/2015 ADMITTING PHYSICIAN: Wyatt Hasteavid K Hower, MD  DATE OF DISCHARGE: 10/14/2015  PRIMARY CARE PHYSICIAN: Randie HeinzAnne Marie Mukamana, NP    ADMISSION DIAGNOSIS:  Delirium [R41.0] UTI (lower urinary tract infection) [N39.0]  DISCHARGE DIAGNOSIS:  Active Problems:   Acute encephalopathy   SECONDARY DIAGNOSIS:   Past Medical History:  Diagnosis Date  . Dementia   . Depression     HOSPITAL COURSE:   * Encephalopathy- due to sedating meds.   No signs of infections found.  Pt was stable and walked with PT.  DISCHARGE CONDITIONS:   Stable.  CONSULTS OBTAINED:  Treatment Team:  Wyatt Hasteavid K Hower, MD  DRUG ALLERGIES:  No Known Allergies  DISCHARGE MEDICATIONS:   Current Discharge Medication List    START taking these medications   Details  feeding supplement, ENSURE ENLIVE, (ENSURE ENLIVE) LIQD Take 237 mLs by mouth 3 (three) times daily between meals. Qty: 237 mL, Refills: 12      CONTINUE these medications which have NOT CHANGED   Details  acetaminophen (TYLENOL) 500 MG tablet Take 500 mg by mouth at bedtime as needed.    aspirin 81 MG chewable tablet Chew 81 mg by mouth daily.    donepezil (ARICEPT) 10 MG tablet Take 10 mg by mouth at bedtime.    sertraline (ZOLOFT) 50 MG tablet Take 50 mg by mouth at bedtime.      STOP taking these medications     Melatonin 3 MG TABS          DISCHARGE INSTRUCTIONS:    Follow with PMD in 2 weeks.  If you experience worsening of your admission symptoms, develop shortness of breath, life threatening emergency, suicidal or homicidal thoughts you must seek medical attention immediately by calling 911 or calling your MD immediately  if symptoms less severe.  You Must read complete instructions/literature along with all the possible adverse reactions/side effects for  all the Medicines you take and that have been prescribed to you. Take any new Medicines after you have completely understood and accept all the possible adverse reactions/side effects.   Please note  You were cared for by a hospitalist during your hospital stay. If you have any questions about your discharge medications or the care you received while you were in the hospital after you are discharged, you can call the unit and asked to speak with the hospitalist on call if the hospitalist that took care of you is not available. Once you are discharged, your primary care physician will handle any further medical issues. Please note that NO REFILLS for any discharge medications will be authorized once you are discharged, as it is imperative that you return to your primary care physician (or establish a relationship with a primary care physician if you do not have one) for your aftercare needs so that they can reassess your need for medications and monitor your lab values.    Today   CHIEF COMPLAINT:   Chief Complaint  Patient presents with  . Altered Mental Status    HISTORY OF PRESENT ILLNESS:  Marie PellegriniMary Vega  is a 82 y.o. female with a known history of Dementia who is presenting from her nursing facility with altered mental status. Patient unable to provide meaningful information history obtained from her department staff. Apparently she was more confused and sleepy this  morning thus sent to Hospital further workup and evaluation. Of note recently evaluated hospital 2 days ago for the similar thing. Patient unable to provide meaningful information given mental status  VITAL SIGNS:  Blood pressure (!) 181/62, pulse 66, temperature 98.5 F (36.9 C), temperature source Oral, resp. rate 18, height 5\' 4"  (1.626 m), weight 47.6 kg (105 lb), SpO2 98 %.  I/O:   Intake/Output Summary (Last 24 hours) at 10/14/15 1550 Last data filed at 10/14/15 0900  Gross per 24 hour  Intake                0 ml   Output                0 ml  Net                0 ml    PHYSICAL EXAMINATION:   GENERAL:82 y.o.female currently in no acute distress. Pleasantly confused HEAD: Normocephalic, atraumatic.  EYES: Pupils equal, round, reactive to light. Extraocular muscles intact. No scleral icterus.  MOUTH: Moist mucosal membrane. Dentition intact. No abscess noted.  EAR, NOSE, THROAT: Clear without exudates. No external lesions.  NECK: Supple. No thyromegaly. No nodules. No JVD.  PULMONARY: Clear to ascultation, without wheeze rails or rhonci. No use of accessory muscles, Good respiratory effort. good air entry bilaterally CHEST: Nontender to palpation.  CARDIOVASCULAR: S1 and S2. Regular rate and rhythm. No murmurs, rubs, or gallops. No edema. Pedal pulses 2+ bilaterally.  GASTROINTESTINAL: Soft, nontender, nondistended. No masses. Positive bowel sounds. No hepatosplenomegaly.  MUSCULOSKELETAL: No swelling, clubbing, or edema. Range of motion full in all extremities.  NEUROLOGIC: Cranial nerves II through XII are intact. No gross focal neurological deficits. Sensation intact. Reflexes intact.  SKIN: No ulceration, lesions, rashes, or cyanosis. Skin warm and dry. Turgor intact.  PSYCHIATRIC: Mood, affect within normal limits. The patient is awake, alert and oriented x self and location. Insight, judgment poor.   DATA REVIEW:   CBC  Recent Labs Lab 10/13/15 1236  WBC 7.6  HGB 13.4  HCT 39.5  PLT 247    Chemistries   Recent Labs Lab 10/11/15 2137 10/13/15 1236  NA 138 140  K 3.6 3.3*  CL 102 101  CO2 29 30  GLUCOSE 106* 117*  BUN 16 14  CREATININE 0.90 0.72  CALCIUM 8.8* 9.0  AST 24  --   ALT 17  --   ALKPHOS 50  --   BILITOT 0.6  --     Cardiac Enzymes  Recent Labs Lab 10/13/15 1236  TROPONINI <0.03    Microbiology Results  Results for orders placed or performed during the hospital encounter of 10/13/15  MRSA PCR Screening     Status: None   Collection Time: 10/14/15   1:46 PM  Result Value Ref Range Status   MRSA by PCR NEGATIVE NEGATIVE Final    Comment:        The GeneXpert MRSA Assay (FDA approved for NASAL specimens only), is one component of a comprehensive MRSA colonization surveillance program. It is not intended to diagnose MRSA infection nor to guide or monitor treatment for MRSA infections.     RADIOLOGY:  Dg Chest 2 View  Result Date: 10/13/2015 CLINICAL DATA:  Altered mental status EXAM: CHEST  2 VIEW COMPARISON:  None. FINDINGS: There is no edema or consolidation. The heart size and pulmonary vascularity are normal. No adenopathy. There is atherosclerotic calcification in the aortic arch. There is degenerative change in the thoracic spine.  IMPRESSION: Aortic atherosclerosis.  No edema or consolidation. Electronically Signed   By: Bretta Bang III M.D.   On: 10/13/2015 13:40   Ct Head Wo Contrast  Result Date: 10/13/2015 CLINICAL DATA:  Slurred speech and gait disturbance.  Confusion. EXAM: CT HEAD WITHOUT CONTRAST TECHNIQUE: Contiguous axial images were obtained from the base of the skull through the vertex without intravenous contrast. COMPARISON:  October 11, 2015 FINDINGS: Brain: Moderate diffuse atrophy is stable. There is no intracranial mass, hemorrhage, extra-axial fluid collection, or midline shift. There is patchy small vessel disease throughout the centra semiovale bilaterally, stable. No new gray-white compartment lesion. No acute infarct evident. Vascular: There are no hyperdense vessels. There is calcification in each carotid siphon region. Skull: Bony calvarium appears intact. Sinuses/Orbits: There is opacification in the inferior right frontal sinus region. There is opacification of a posterior right ethmoid air cell. Visualized orbits appear symmetric bilaterally. Other: Mastoid air cells are clear. IMPRESSION: Atrophy with periventricular small vessel disease, stable. No intracranial mass, hemorrhage, or acute appearing  infarct. There are areas of paranasal sinus disease. There are foci of arterial vascular calcification. Electronically Signed   By: Bretta Bang III M.D.   On: 10/13/2015 14:10    EKG:   Orders placed or performed during the hospital encounter of 10/13/15  . ED EKG  . ED EKG  . EKG 12-Lead  . EKG 12-Lead      Management plans discussed with the patient, family and they are in agreement.  CODE STATUS:     Code Status Orders        Start     Ordered   10/13/15 1856  Full code  Continuous     10/13/15 1855    Code Status History    Date Active Date Inactive Code Status Order ID Comments User Context   This patient has a current code status but no historical code status.      TOTAL TIME TAKING CARE OF THIS PATIENT: 35 minutes.    Altamese Dilling M.D on 10/14/2015 at 3:50 PM  Between 7am to 6pm - Pager - [phone]  After 6pm go to [email or web address] - Social research officer, government  [facility]  Office  [phone]  CC: Primary care physician; Randie Heinz, NP   Note: This dictation was prepared with Dragon dictation along with smaller phrase technology. Any transcriptional errors that result from this process are unintentional.

## 2015-10-14 NOTE — NC FL2 (Signed)
  [place] MEDICAID FL2 LEVEL OF CARE SCREENING TOOL     IDENTIFICATION  Patient Name: Marie PellegriniMary Vega Birthdate: 10/29/1919 Sex: female Admission Date (Current Location): 10/13/2015  [place]   Facility and Address:  [facility], [address], [place], [place] [address]      Provider Number: [id number]  Attending Physician Name and Address:  Altamese DillingVaibhavkumar Baily Hovanec, MD  Relative Name and Phone Number:       Current Level of Care: Domiciliary (Rest home) Recommended Level of Care: Memory Care, Assisted Living Facility Prior Approval Number:    Date Approved/Denied:   PASRR Number:    Discharge Plan: Domiciliary (Rest home)    Current Diagnoses: Primary: Dementia  Patient Active Problem List   Diagnosis Date Noted  . Acute encephalopathy 10/13/2015  UTI (lower urinary tract infection) [N39.0]  Orientation RESPIRATION BLADDER Height & Weight     Self  Normal Incontinent Weight: 105 lb (47.6 kg) Height:  5\' 4"  (162.6 cm)  BEHAVIORAL SYMPTOMS/MOOD NEUROLOGICAL BOWEL NUTRITION STATUS   (none)  (none ) Continent Diet (Diet: Heart Healthy )  AMBULATORY STATUS COMMUNICATION OF NEEDS Skin   Supervision Verbally Normal                       Personal Care Assistance Level of Assistance  Bathing, Feeding, Dressing Bathing Assistance: Limited assistance Feeding assistance: Independent Dressing Assistance: Limited assistance     Functional Limitations Info  Sight, Hearing, Speech Sight Info: Adequate Hearing Info: Adequate Speech Info: Adequate    SPECIAL CARE FACTORS FREQUENCY  PT (By licensed PT)     PT Frequency:  (home health PT 2-3 days per week. )              Contractures      Additional Factors Info  Code Status, Allergies Code Status Info:  (Full Code. ) Allergies Info:  (No Known Allergies. )          Discharge Medications: Please see discharge summary for a list of discharge  medications. DISCHARGE MEDICATIONS:       Current Discharge Medication List        START taking these medications   Details  feeding supplement, ENSURE ENLIVE, (ENSURE ENLIVE) LIQD Take 237 mLs by mouth 3 (three) times daily between meals. Qty: 237 mL, Refills: 12          CONTINUE these medications which have NOT CHANGED   Details  acetaminophen (TYLENOL) 500 MG tablet Take 500 mg by mouth at bedtime as needed.    aspirin 81 MG chewable tablet Chew 81 mg by mouth daily.    donepezil (ARICEPT) 10 MG tablet Take 10 mg by mouth at bedtime.    sertraline (ZOLOFT) 50 MG tablet Take 50 mg by mouth at bedtime.         STOP taking these medications     Melatonin 3 MG TABS     Relevant Imaging Results: Relevant Lab Results: Additional Information  (SSN: [id number])  Sample, Marie CrockerBailey M, LCSW

## 2015-10-14 NOTE — Progress Notes (Addendum)
Pt being discharged back to ALF, report called to facility. Discharged packet will be provided to pt son who will transport pt back to facility. She will be leaving with all her belongings, including walker.

## 2015-10-14 NOTE — Care Management Note (Signed)
Case Management Note  Patient Details  Name: Marie Vega MRN: [id number] Date of Birth: 11/27/1919  Subjective/Objective:                    Action/Plan: anticipated discharge plan is [facility] ... DME orders placed for rolling walker.  CSW following for placement.  Expected Discharge Date:                  Expected Discharge Plan:  Assisted Living / Rest Home  In-House Referral:     Discharge planning Services  CM Consult  Post Acute Care Choice:    Choice offered to:     DME Arranged:    DME Agency:     HH Arranged:    HH Agency:     Status of Service:  In process, will continue to follow  If discussed at Long Length of Stay Meetings, dates discussed:    Additional Comments:  Adonis HugueninBerkhead, Alamin Mccuiston L, RN 10/14/2015, 3:11 PM

## 2015-10-14 NOTE — Progress Notes (Signed)
Patient is medically stable for D/C back to [facility]. PT is recommending home health and a walker. RN Case Manager arranged for a walker to be delivered to patient's room. Per Samantha CrimesJessica Social Worker at [facility] patient can return today since she is at baseline. Clinical Child psychotherapistocial Worker (CSW) prepared D/C packet and sent D/C orders to [facility]. CSW contacted patient's daughter in law [employer] and made her aware of above. Per daughter in law they will pick patient up today at 5:30 this afternoon. RN called report and is aware of above. Please reconsult if future social work needs arise. CSW signing off.   Baker Hughes IncorporatedBailey Irven Ingalsbe, LCSW [phone]

## 2015-10-15 ENCOUNTER — Encounter: Payer: Self-pay | Admitting: Emergency Medicine

## 2015-10-15 ENCOUNTER — Emergency Department
Admission: EM | Admit: 2015-10-15 | Discharge: 2015-10-16 | Disposition: A | Discharge status: Home / Self Care | Payer: Medicare Other | Attending: Emergency Medicine | Admitting: Emergency Medicine

## 2015-10-15 DIAGNOSIS — Z7982 Long term (current) use of aspirin: Secondary | ICD-10-CM | POA: Diagnosis not present

## 2015-10-15 DIAGNOSIS — W19XXXA Unspecified fall, initial encounter: Secondary | ICD-10-CM | POA: Diagnosis not present

## 2015-10-15 DIAGNOSIS — Z79899 Other long term (current) drug therapy: Secondary | ICD-10-CM | POA: Diagnosis not present

## 2015-10-15 DIAGNOSIS — Y939 Activity, unspecified: Secondary | ICD-10-CM | POA: Insufficient documentation

## 2015-10-15 DIAGNOSIS — Y999 Unspecified external cause status: Secondary | ICD-10-CM | POA: Insufficient documentation

## 2015-10-15 DIAGNOSIS — Y929 Unspecified place or not applicable: Secondary | ICD-10-CM | POA: Insufficient documentation

## 2015-10-15 DIAGNOSIS — F0390 Unspecified dementia without behavioral disturbance: Secondary | ICD-10-CM | POA: Insufficient documentation

## 2015-10-15 DIAGNOSIS — R52 Pain, unspecified: Secondary | ICD-10-CM | POA: Diagnosis present

## 2015-10-15 NOTE — ED Triage Notes (Signed)
Pt from [facility] due to facility finding pt on the floor. Pt c/o of pain but unable to state where pain is located. [facility] stated 155/62 BP and pulse of 64.

## 2015-10-16 ENCOUNTER — Emergency Department: Payer: Medicare Other

## 2015-10-16 DIAGNOSIS — F0390 Unspecified dementia without behavioral disturbance: Secondary | ICD-10-CM | POA: Diagnosis not present

## 2015-10-16 LAB — BASIC METABOLIC PANEL
ANION GAP: 8 (ref 5–15)
BUN: 15 mg/dL (ref 6–20)
CHLORIDE: 102 mmol/L (ref 101–111)
CO2: 31 mmol/L (ref 22–32)
Calcium: 9.2 mg/dL (ref 8.9–10.3)
Creatinine, Ser: 0.73 mg/dL (ref 0.44–1.00)
GFR calc Af Amer: >60 mL/min (ref >60)
GLUCOSE: 90 mg/dL (ref 65–99)
POTASSIUM: 3.6 mmol/L (ref 3.5–5.1)
Sodium: 141 mmol/L (ref 135–145)

## 2015-10-16 LAB — CBC WITH DIFFERENTIAL/PLATELET
BASOS ABS: 0.1 10*3/uL (ref 0–0.1)
Basophils Relative: 1 %
EOS PCT: 3 %
Eosinophils Absolute: 0.3 10*3/uL (ref 0–0.7)
HEMATOCRIT: 44.1 % (ref 35.0–47.0)
Hemoglobin: 14.6 g/dL (ref 12.0–16.0)
LYMPHS ABS: 2.3 10*3/uL (ref 1.0–3.6)
LYMPHS PCT: 24 %
MCH: 29 pg (ref 26.0–34.0)
MCHC: 33 g/dL (ref 32.0–36.0)
MCV: 87.7 fL (ref 80.0–100.0)
MONO ABS: 1.1 10*3/uL — AB (ref 0.2–0.9)
Monocytes Relative: 12 %
NEUTROS ABS: 5.9 10*3/uL (ref 1.4–6.5)
Neutrophils Relative %: 60 %
PLATELETS: 247 10*3/uL (ref 150–440)
RBC: 5.02 MIL/uL (ref 3.80–5.20)
RDW: 14.1 % (ref 11.5–14.5)
WBC: 9.6 10*3/uL (ref 3.6–11.0)

## 2015-10-16 LAB — URINE CULTURE: SPECIAL REQUESTS: NORMAL

## 2015-10-16 MED ORDER — SODIUM CHLORIDE 0.9 % IV BOLUS (SEPSIS)
1000.0000 mL | Freq: Once | INTRAVENOUS | Status: AC
  Administered 2015-10-16: 1000 mL via INTRAVENOUS

## 2015-10-16 MED ORDER — CEPHALEXIN 500 MG PO CAPS: 500.0000 mg | ORAL_CAPSULE | Freq: Two times a day (BID) | ORAL | 0 refills | Status: DC

## 2015-10-16 NOTE — ED Provider Notes (Signed)
[facility] Emergency Department Provider Note  ____________________________________________  Time seen: Approximately 12:29 AM  I have reviewed the triage vital signs and the nursing notes.   HISTORY  Chief Complaint Fall Level 5 caveat:  Portions of the history and physical were unable to be obtained due to the patient's chronic advanced dementia    HPI Marie Vega is a 82 y.o. female sent to the ED from [place] house due to an unwitnessed fall. The patient was found on the ground. Patient had initially reported some pain that she was unable to localize her described. Currently the patient denies any symptoms. Denies any pain to me.     Past Medical History:  Diagnosis Date  . Dementia   . Depression      Patient Active Problem List   Diagnosis Date Noted  . Acute encephalopathy 10/13/2015     History reviewed. No pertinent surgical history.   Prior to Admission medications   Medication Sig Start Date End Date Taking? Authorizing Provider  acetaminophen (TYLENOL) 500 MG tablet Take 500 mg by mouth at bedtime as needed.    Historical Provider, MD  aspirin 81 MG chewable tablet Chew 81 mg by mouth daily.    Historical Provider, MD  cephALEXin (KEFLEX) 500 MG capsule Take 1 capsule (500 mg total) by mouth 2 (two) times daily. 10/16/15   Sharman CheekPhillip Aerin Delany, MD  donepezil (ARICEPT) 10 MG tablet Take 10 mg by mouth at bedtime.    Historical Provider, MD  feeding supplement, ENSURE ENLIVE, (ENSURE ENLIVE) LIQD Take 237 mLs by mouth 3 (three) times daily between meals. 10/14/15   Altamese DillingVaibhavkumar Vachhani, MD  sertraline (ZOLOFT) 50 MG tablet Take 50 mg by mouth at bedtime.    Historical Provider, MD     Allergies Review of patient's allergies indicates no known allergies.   Family History  Problem Relation Age of Onset  . Hypertension Other     Social History Social History  Substance Use Topics  . Smoking status: Never Smoker  . Smokeless  tobacco: Never Used  . Alcohol use No    Review of Systems Unable to obtain  ____________________________________________   PHYSICAL EXAM:  VITAL SIGNS: ED Triage Vitals  Enc Vitals Group     BP 10/15/15 2033 (!) 154/60     Pulse Rate 10/15/15 2033 63     Resp 10/15/15 2033 (!) 22     Temp 10/15/15 2033 98.2 F (36.8 C)     Temp Source 10/15/15 2033 Oral     SpO2 10/15/15 2033 98 %     Weight 10/15/15 2035 100 lb (45.4 kg)     Height 10/15/15 2035 5\' 4"  (1.626 m)     Head Circumference --      Peak Flow --      Pain Score --      Pain Loc --      Pain Edu? --      Excl. in GC? --     Vital signs reviewed, nursing assessments reviewed.   Constitutional:   Alert and oriented To self. Well appearing and in no distress. Eyes:   No scleral icterus. No conjunctival pallor. PERRL. EOMI.  No nystagmus. ENT   Head:   Normocephalic and atraumatic.   Nose:   No congestion/rhinnorhea. No septal hematoma   Mouth/Throat:   MMM, no pharyngeal erythema. No peritonsillar mass.    Neck:   No stridor. No SubQ emphysema. No meningismus. Hematological/Lymphatic/Immunilogical:   No cervical lymphadenopathy. Cardiovascular:  RRR. Symmetric bilateral radial and DP pulses.  No murmurs.  Respiratory:   Normal respiratory effort without tachypnea nor retractions. Breath sounds are clear and equal bilaterally. No wheezes/rales/rhonchi. Gastrointestinal:   Soft and nontender. Non distended. There is no CVA tenderness.  No rebound, rigidity, or guarding. Genitourinary:   deferred Musculoskeletal:   Nontender with normal range of motion in all extremities. No joint effusions.  No lower extremity tenderness.  No edema. Pelvis stable and nontender. No midline spinal tenderness. Slight tenderness in the right anterior inferior chest wall without crepitus or instability. Neurologic:   Repetitive phrases, patient frequently says "thank you".  CN 2-10 normal. Motor grossly intact. No  acute focal neurologic deficits are appreciated.  Skin:    Skin is warm, dry and intact. No rash noted.  No petechiae, purpura, or bullae.  ____________________________________________    LABS (pertinent positives/negatives) (all labs ordered are listed, but only abnormal results are displayed) Labs Reviewed  CBC WITH DIFFERENTIAL/PLATELET - Abnormal; Notable for the following:       Result Value   Monocytes Absolute 1.1 (*)    All other components within normal limits  BASIC METABOLIC PANEL  URINALYSIS COMPLETEWITH MICROSCOPIC ([facility] ONLY)   ____________________________________________   EKG  Interpreted by me Sinus rhythm rate of 61. Normal axis and intervals. Normal QRS ST segments and T waves  ____________________________________________    RADIOLOGY  Chest x-ray unremarkable X-ray pelvis unremarkable  ____________________________________________   PROCEDURES Procedures  ____________________________________________   INITIAL IMPRESSION / ASSESSMENT AND PLAN / ED COURSE  Pertinent labs & imaging results that were available during my care of the patient were reviewed by me and considered in my medical decision making (see chart for details).  Patient well appearing no acute distress presents for evaluation after unwitnessed fall. Skeletal exam is unremarkable, but due to dementia will be urinalysis chest x-ray pelvis x-ray. Patient appears to be stable for discharge home after screening workup.     Clinical Course    ----------------------------------------- 3:39 AM on 10/16/2015 -----------------------------------------  X-rays unremarkable. Labs unremarkable. Obtaining urine sample technically challenging in the ED. We'll treat presumptively due to small amount of foul urine that was noted by nursing. ____________________________________________   FINAL CLINICAL IMPRESSION(S) / ED DIAGNOSES  Final diagnoses:  Fall, initial encounter  Chronic  dementia, without behavioral disturbance       Portions of this note were generated with dragon dictation software. Dictation errors may occur despite best attempts at proofreading.    Sharman Cheek, MD 10/16/15 [phone]

## 2015-10-16 NOTE — ED Notes (Addendum)
Attempted to collect urine x2. 76 ml show on the bladder scanner but no urine collected at this time. MD [facility] aware.

## 2015-11-24 ENCOUNTER — Emergency Department
Admission: EM | Admit: 2015-11-24 | Discharge: 2015-11-24 | Disposition: A | Discharge status: Home / Self Care | Payer: Medicare Other | Attending: Emergency Medicine | Admitting: Emergency Medicine

## 2015-11-24 ENCOUNTER — Emergency Department: Payer: Medicare Other

## 2015-11-24 DIAGNOSIS — W19XXXA Unspecified fall, initial encounter: Secondary | ICD-10-CM | POA: Diagnosis not present

## 2015-11-24 DIAGNOSIS — M25551 Pain in right hip: Secondary | ICD-10-CM | POA: Insufficient documentation

## 2015-11-24 DIAGNOSIS — Y929 Unspecified place or not applicable: Secondary | ICD-10-CM | POA: Diagnosis not present

## 2015-11-24 DIAGNOSIS — Z7982 Long term (current) use of aspirin: Secondary | ICD-10-CM | POA: Insufficient documentation

## 2015-11-24 DIAGNOSIS — F0391 Unspecified dementia with behavioral disturbance: Secondary | ICD-10-CM

## 2015-11-24 DIAGNOSIS — M25519 Pain in unspecified shoulder: Secondary | ICD-10-CM | POA: Diagnosis not present

## 2015-11-24 DIAGNOSIS — Z791 Long term (current) use of non-steroidal anti-inflammatories (NSAID): Secondary | ICD-10-CM | POA: Diagnosis not present

## 2015-11-24 DIAGNOSIS — Y999 Unspecified external cause status: Secondary | ICD-10-CM | POA: Insufficient documentation

## 2015-11-24 DIAGNOSIS — Y939 Activity, unspecified: Secondary | ICD-10-CM | POA: Insufficient documentation

## 2015-11-24 NOTE — ED Provider Notes (Signed)
[facility] Emergency Department Provider Note  ____________________________________________  Time seen: Approximately 7:18 PM  I have reviewed the triage vital signs and the nursing notes.   HISTORY  Chief Complaint Fall  Level 5 caveat:  Portions of the history and physical were unable to be obtained due to the patient's chronic dementia   HPI Marie Vega is a 82 y.o. female sent to the ED for evaluation after a fall. She just received a ceftriaxone intramuscular injection, and the patient had an apparent mechanical fall. No head injury. EMS report that she had complained of pain all over but was unable to specify. To me she complains of shoulder pain but is unable to specify left or right.  Patient is unable to give any history due to her advanced dementia. The only things she really says are expletives and other forms of swearing at staff.     Past Medical History:  Diagnosis Date  . Dementia   . Depression      Patient Active Problem List   Diagnosis Date Noted  . Acute encephalopathy 10/13/2015     No past surgical history on file.   Prior to Admission medications   Medication Sig Start Date End Date Taking? Authorizing Provider  acetaminophen (TYLENOL) 500 MG tablet Take 500 mg by mouth at bedtime as needed.    Historical Provider, MD  aspirin 81 MG chewable tablet Chew 81 mg by mouth daily.    Historical Provider, MD  cephALEXin (KEFLEX) 500 MG capsule Take 1 capsule (500 mg total) by mouth 2 (two) times daily. 10/16/15   Sharman Cheek, MD  donepezil (ARICEPT) 10 MG tablet Take 10 mg by mouth at bedtime.    Historical Provider, MD  feeding supplement, ENSURE ENLIVE, (ENSURE ENLIVE) LIQD Take 237 mLs by mouth 3 (three) times daily between meals. 10/14/15   Altamese Dilling, MD  sertraline (ZOLOFT) 50 MG tablet Take 50 mg by mouth at bedtime.    Historical Provider, MD     Allergies Review of patient's allergies indicates no known  allergies.   Family History  Problem Relation Age of Onset  . Hypertension Other     Social History Social History  Substance Use Topics  . Smoking status: Never Smoker  . Smokeless tobacco: Never Used  . Alcohol use No    Review of Systems Unable to obtain ____________________________________________   PHYSICAL EXAM:  VITAL SIGNS: ED Triage Vitals [11/24/15 1747]  Enc Vitals Group     BP (!) 137/99     Pulse Rate 66     Resp 18     Temp 97.8 F (36.6 C)     Temp Source Axillary     SpO2 100 %     Weight 110 lb 12.8 oz (50.3 kg)     Height      Head Circumference      Peak Flow      Pain Score      Pain Loc      Pain Edu?      Excl. in GC?     Vital signs reviewed, nursing assessments reviewed.   Constitutional:   Alert , completely confused. Well appearing and in no distress. Eyes:   No scleral icterus. No conjunctival pallor. PERRL. EOMI.  No nystagmus. ENT   Head:   Normocephalic and atraumatic.   Nose:   No congestion/rhinnorhea. No septal hematoma   Mouth/Throat:   MMM, no pharyngeal erythema. No peritonsillar mass.    Neck:  No stridor. No SubQ emphysema. No meningismus. No midline spinal tenderness. Full range of motion. Hematological/Lymphatic/Immunilogical:   No cervical lymphadenopathy. Cardiovascular:   RRR. Symmetric bilateral radial and DP pulses.  No murmurs.  Respiratory:   Normal respiratory effort without tachypnea nor retractions. Breath sounds are clear and equal bilaterally. No wheezes/rales/rhonchi. Gastrointestinal:   Soft and nontender. Non distended. There is no CVA tenderness.  No rebound, rigidity, or guarding. Genitourinary:   deferred Musculoskeletal:   Mild tenderness in the right lateral hip area. normal range of motion in all extremities. No joint effusions.  No lower extremity tenderness.  No edema. No midline spinal tenderness Neurologic:   Normal speech mechanics, inappropriate language..  CN 2-10  normal. Motor grossly intact.   Skin:    Skin is warm, dry and intact. No rash noted.  No petechiae, purpura, or bullae.  ____________________________________________    LABS (pertinent positives/negatives) (all labs ordered are listed, but only abnormal results are displayed) Labs Reviewed - No data to display ____________________________________________   EKG    ____________________________________________    RADIOLOGY  Chest x-ray unremarkable X-ray pelvis unremarkable   ____________________________________________   PROCEDURES Procedures  ____________________________________________   INITIAL IMPRESSION / ASSESSMENT AND PLAN / ED COURSE  Pertinent labs & imaging results that were available during my care of the patient were reviewed by me and considered in my medical decision making (see chart for details).  Patient brought to ED for evaluation after a fall. Not on blood thinners. No evidence of head injury or spinal injury. Unclear if she has any acute pain or complaints. Pelvis and chest x-rays performed, unremarkable. We'll discharge home to follow up with primary care.     Clinical Course   ____________________________________________   FINAL CLINICAL IMPRESSION(S) / ED DIAGNOSES  Final diagnoses:  Dementia with behavioral disturbance, unspecified dementia type  Shoulder pain, unspecified chronicity, unspecified laterality  Fall, initial encounter       Portions of this note were generated with dragon dictation software. Dictation errors may occur despite best attempts at proofreading.    Sharman CheekPhillip Lorely Bubb, MD 11/24/15 Ernestina [id number]
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
## 2015-11-24 NOTE — ED Notes (Signed)
D/C instructions reviewed with pt. And also with [facility]. Understanding verbalized.

## 2015-11-24 NOTE — ED Notes (Signed)
Gave pt blanket and activity belt. Put bed alarm on bed.

## 2015-12-12 ENCOUNTER — Emergency Department: Payer: Medicare Other

## 2015-12-12 ENCOUNTER — Emergency Department
Admission: EM | Admit: 2015-12-12 | Discharge: 2015-12-12 | Disposition: A | Discharge status: Home / Self Care | Payer: Medicare Other | Attending: Emergency Medicine | Admitting: Emergency Medicine

## 2015-12-12 DIAGNOSIS — F0390 Unspecified dementia without behavioral disturbance: Secondary | ICD-10-CM | POA: Diagnosis not present

## 2015-12-12 DIAGNOSIS — S022XXA Fracture of nasal bones, initial encounter for closed fracture: Secondary | ICD-10-CM | POA: Diagnosis not present

## 2015-12-12 DIAGNOSIS — W01198A Fall on same level from slipping, tripping and stumbling with subsequent striking against other object, initial encounter: Secondary | ICD-10-CM | POA: Diagnosis not present

## 2015-12-12 DIAGNOSIS — Y92001 Dining room of unspecified non-institutional (private) residence as the place of occurrence of the external cause: Secondary | ICD-10-CM | POA: Insufficient documentation

## 2015-12-12 DIAGNOSIS — S0990XA Unspecified injury of head, initial encounter: Secondary | ICD-10-CM

## 2015-12-12 DIAGNOSIS — Y999 Unspecified external cause status: Secondary | ICD-10-CM | POA: Insufficient documentation

## 2015-12-12 DIAGNOSIS — Y9389 Activity, other specified: Secondary | ICD-10-CM | POA: Diagnosis not present

## 2015-12-12 HISTORY — DX: Syphilis, unspecified: A53.9

## 2015-12-12 MED ORDER — LORAZEPAM 2 MG/ML IJ SOLN
2.0000 mg | Freq: Once | INTRAMUSCULAR | Status: AC
  Administered 2015-12-12: 2 mg via INTRAVENOUS
  Filled 2015-12-12: qty 1

## 2015-12-12 NOTE — ED Notes (Signed)
Patient sleeping

## 2015-12-12 NOTE — ED Notes (Signed)
Reviewed d/c instructions, follow-up care with pt's son and Dayna BarkerKaren Robertson - Caregiver at [facility]. Pt's son and caregiver verbalized understanding.

## 2015-12-12 NOTE — ED Triage Notes (Addendum)
Pt from [facility]. Pt has hx of dementia from syphillis. Pt has hx of auditory and visual hallucinations. Pt aggressive and combative. Pt hitting/biting/spitting.   Pt stumbled in dining hall, fell and hit head on counter. Pt did not lose consciousness, however patient babbling when for short period after fall.   Pt has hematoma on left eye, dried blood on face, and epistasis.

## 2015-12-12 NOTE — ED Notes (Signed)
Called patient's son and updated him on patient status

## 2015-12-12 NOTE — ED Notes (Signed)
Called patient's son, Jomarie LongsJoseph and updated him on patient status
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
## 2015-12-12 NOTE — Discharge Instructions (Signed)
Patient's head and neck CT showed noted abnormalities. She does have a comminuted nasal fracture that is closed. Further evaluation and treatment is really up to the family for nasal fracture. Patient may have some bleeding from the nares.  Please return immediately if condition worsens. Please contact her primary physician or the physician you were given for referral. If you have any specialist physicians involved in her treatment and plan please also contact them. Thank you for using [facility].

## 2015-12-12 NOTE — ED Notes (Signed)
RN accompanied sitter and patient to CT.

## 2015-12-13 ENCOUNTER — Emergency Department
Admission: EM | Admit: 2015-12-13 | Discharge: 2015-12-13 | Disposition: A | Discharge status: Home / Self Care | Payer: Medicare Other | Attending: Emergency Medicine | Admitting: Emergency Medicine

## 2015-12-13 ENCOUNTER — Encounter: Payer: Self-pay | Admitting: Emergency Medicine

## 2015-12-13 DIAGNOSIS — Y939 Activity, unspecified: Secondary | ICD-10-CM | POA: Diagnosis not present

## 2015-12-13 DIAGNOSIS — Z7982 Long term (current) use of aspirin: Secondary | ICD-10-CM | POA: Insufficient documentation

## 2015-12-13 DIAGNOSIS — Y999 Unspecified external cause status: Secondary | ICD-10-CM | POA: Diagnosis not present

## 2015-12-13 DIAGNOSIS — W19XXXA Unspecified fall, initial encounter: Secondary | ICD-10-CM | POA: Insufficient documentation

## 2015-12-13 DIAGNOSIS — S0511XA Contusion of eyeball and orbital tissues, right eye, initial encounter: Secondary | ICD-10-CM | POA: Insufficient documentation

## 2015-12-13 DIAGNOSIS — Y929 Unspecified place or not applicable: Secondary | ICD-10-CM | POA: Diagnosis not present

## 2015-12-13 DIAGNOSIS — S022XXA Fracture of nasal bones, initial encounter for closed fracture: Secondary | ICD-10-CM | POA: Diagnosis not present

## 2015-12-13 DIAGNOSIS — Z791 Long term (current) use of non-steroidal anti-inflammatories (NSAID): Secondary | ICD-10-CM | POA: Diagnosis not present

## 2015-12-13 DIAGNOSIS — R040 Epistaxis: Secondary | ICD-10-CM

## 2015-12-13 DIAGNOSIS — S0992XA Unspecified injury of nose, initial encounter: Secondary | ICD-10-CM | POA: Diagnosis present

## 2015-12-13 DIAGNOSIS — S0512XA Contusion of eyeball and orbital tissues, left eye, initial encounter: Secondary | ICD-10-CM | POA: Insufficient documentation

## 2015-12-13 MED ORDER — OXYMETAZOLINE HCL 0.05 % NA SOLN
1.0000 | Freq: Once | NASAL | Status: DC
  Filled 2015-12-13: qty 15

## 2015-12-13 MED ORDER — OXYMETAZOLINE HCL 0.05 % NA SOLN: 2.0000 | Freq: Three times a day (TID) | NASAL | 2 refills | Status: AC | PRN

## 2015-12-13 MED ORDER — CEPHALEXIN 500 MG PO CAPS
500.0000 mg | ORAL_CAPSULE | Freq: Once | ORAL | Status: AC
Start: 2015-12-13 — End: 2015-12-13
  Administered 2015-12-13: 500 mg via ORAL
  Filled 2015-12-13: qty 1

## 2015-12-13 MED ORDER — CEPHALEXIN 500 MG PO CAPS: 500.0000 mg | ORAL_CAPSULE | Freq: Three times a day (TID) | ORAL | 0 refills | Status: AC

## 2015-12-13 NOTE — ED Provider Notes (Signed)
[facility]-EMERGENCY DEPARTMENT Provider Note   CSN: [id number] Arrival date & time: 12/13/15  0809     History   Chief Complaint Chief Complaint  Patient presents with  . Epistaxis    HPI Marie Vega is a 82 y.o. female hx of dementia from syphilis here with nose bleed. Came yesterday after a fall and CT showed no intra cranial hemorrhage but there is displaced nasal bone fracture. She was sent back to [facility]. This morning, started having nose bleed from left nostril. No vomiting noted. No additional falls reported.   HPI  Level V caveat- dementia   Past Medical History:  Diagnosis Date  . Dementia   . Depression   . Syphilis     Patient Active Problem List   Diagnosis Date Noted  . Acute encephalopathy 10/13/2015    History reviewed. No pertinent surgical history.  OB History    No data available       Home Medications    Prior to Admission medications   Medication Sig Start Date End Date Taking? Authorizing Provider  acetaminophen (TYLENOL) 500 MG tablet Take 500 mg by mouth at bedtime as needed.    Historical Provider, MD  aspirin 81 MG chewable tablet Chew 81 mg by mouth daily.    Historical Provider, MD  cephALEXin (KEFLEX) 500 MG capsule Take 1 capsule (500 mg total) by mouth 2 (two) times daily. 10/16/15   Sharman Cheek, MD  donepezil (ARICEPT) 10 MG tablet Take 10 mg by mouth at bedtime.    Historical Provider, MD  feeding supplement, ENSURE ENLIVE, (ENSURE ENLIVE) LIQD Take 237 mLs by mouth 3 (three) times daily between meals. 10/14/15   Altamese Dilling, MD  sertraline (ZOLOFT) 50 MG tablet Take 50 mg by mouth at bedtime.    Historical Provider, MD    Family History Family History  Problem Relation Age of Onset  . Hypertension Other     Social History Social History  Substance Use Topics  . Smoking status: Never Smoker  . Smokeless tobacco: Never Used  . Alcohol use No     Allergies   Review of patient's allergies indicates no  known allergies.   Review of Systems Review of Systems  HENT: Positive for nosebleeds.   All other systems reviewed and are negative.    Physical Exam Updated Vital Signs BP (!) 170/67   Pulse 64   Temp 97.4 F (36.3 C) (Axillary)   Resp 16   Wt 110 lb (49.9 kg)   SpO2 98%   BMI 18.30 kg/m   Physical Exam  Constitutional:  Chronically ill, demented   HENT:  Ecchymosis below bilateral eyes. Dry blood L anterior nostril, no obvious septal hematoma. No blood in posterior pharynx.   Eyes: EOM are normal. Pupils are equal, round, and reactive to light.  Neck: Normal range of motion. Neck supple.  Cardiovascular: Normal rate, regular rhythm and normal heart sounds.   Pulmonary/Chest: Effort normal and breath sounds normal.  Abdominal: Soft. Bowel sounds are normal.  Musculoskeletal: Normal range of motion.  Neurological:  Sleepy, arousable. Moving all extremities   Skin: Skin is warm.  Psychiatric:  Unable   Nursing note and vitals reviewed.    ED Treatments / Results  Labs (all labs ordered are listed, but only abnormal results are displayed) Labs Reviewed - No data to display  EKG  EKG Interpretation None       Radiology Ct Head Wo Contrast  Result Date: 12/12/2015 CLINICAL DATA:  Dementia.  Fall today. Bruising to face and forehead. EXAM: CT HEAD WITHOUT CONTRAST CT MAXILLOFACIAL WITHOUT CONTRAST CT CERVICAL SPINE WITHOUT CONTRAST TECHNIQUE: Multidetector CT imaging of the head, cervical spine, and maxillofacial structures were performed using the standard protocol without intravenous contrast. Multiplanar CT image reconstructions of the cervical spine and maxillofacial structures were also generated. COMPARISON:  Head and cervical Spine CT 10/11/2015 FINDINGS: CT HEAD FINDINGS Brain: Ventricles, cisterns and other CSF spaces are within normal. There is minimal age related atrophic change. There is mild chronic ischemic microvascular disease. There is no mass,  mass effect, shift of midline structures or acute hemorrhage. There is no evidence of acute infarction. Vascular: There is moderate calcified plaque over the cavernous segment of the internal carotid arteries. Skull: Displaced nasal bone fracture.  Frontal scalp contusion. Sinuses/Orbits: Orbits within normal. Opacification over the right frontal sinus and minimal opacification over the ethmoid sinus. Tiny air-fluid levels of the maxillary and left sphenoid sinuses. Few flecks of air adjacent the sella but no definite fracture visualized. CT MAXILLOFACIAL FINDINGS Osseous: Displaced slightly comminuted nasal bone fracture. Remaining bony structures within normal. Orbits: Normal and symmetric. Sinuses: Paranasal sinuses are well developed and well aerated. There is moderate opacification over the right frontal sinus unchanged. Tiny air-fluid levels over the maxillary sinus and left sphenoid sinuses likely hemorrhagic debris. Mastoid air cells are clear. Soft tissues: Scalp contusion over the frontal region. CT CERVICAL SPINE FINDINGS Alignment: Within normal. Skull base and vertebrae: Mild spondylosis throughout the cervical spine. There is facet arthropathy and uncovertebral joint spurring. No acute fracture. Soft tissues and spinal canal: Within normal. Disc levels: Disc space narrowing with vacuum disc at the C5-6 level. Upper chest: Within normal. IMPRESSION: No acute intracranial findings. Frontal scalp contusion.  No fracture. Mild chronic ischemic microvascular disease and age related atrophy. Displaced comminuted nasal bone fracture. No other facial bone fractures. No acute cervical spine injury. Mild spondylosis of the cervical spine with disc disease at the C5-6 level. Electronically Signed   By: Elberta Fortis M.D.   On: 12/12/2015 21:56   Ct Cervical Spine Wo Contrast  Result Date: 12/12/2015 CLINICAL DATA:  Dementia. Fall today. Bruising to face and forehead. EXAM: CT HEAD WITHOUT CONTRAST CT  MAXILLOFACIAL WITHOUT CONTRAST CT CERVICAL SPINE WITHOUT CONTRAST TECHNIQUE: Multidetector CT imaging of the head, cervical spine, and maxillofacial structures were performed using the standard protocol without intravenous contrast. Multiplanar CT image reconstructions of the cervical spine and maxillofacial structures were also generated. COMPARISON:  Head and cervical Spine CT 10/11/2015 FINDINGS: CT HEAD FINDINGS Brain: Ventricles, cisterns and other CSF spaces are within normal. There is minimal age related atrophic change. There is mild chronic ischemic microvascular disease. There is no mass, mass effect, shift of midline structures or acute hemorrhage. There is no evidence of acute infarction. Vascular: There is moderate calcified plaque over the cavernous segment of the internal carotid arteries. Skull: Displaced nasal bone fracture.  Frontal scalp contusion. Sinuses/Orbits: Orbits within normal. Opacification over the right frontal sinus and minimal opacification over the ethmoid sinus. Tiny air-fluid levels of the maxillary and left sphenoid sinuses. Few flecks of air adjacent the sella but no definite fracture visualized. CT MAXILLOFACIAL FINDINGS Osseous: Displaced slightly comminuted nasal bone fracture. Remaining bony structures within normal. Orbits: Normal and symmetric. Sinuses: Paranasal sinuses are well developed and well aerated. There is moderate opacification over the right frontal sinus unchanged. Tiny air-fluid levels over the maxillary sinus and left sphenoid sinuses likely hemorrhagic debris. Mastoid air cells  are clear. Soft tissues: Scalp contusion over the frontal region. CT CERVICAL SPINE FINDINGS Alignment: Within normal. Skull base and vertebrae: Mild spondylosis throughout the cervical spine. There is facet arthropathy and uncovertebral joint spurring. No acute fracture. Soft tissues and spinal canal: Within normal. Disc levels: Disc space narrowing with vacuum disc at the C5-6  level. Upper chest: Within normal. IMPRESSION: No acute intracranial findings. Frontal scalp contusion.  No fracture. Mild chronic ischemic microvascular disease and age related atrophy. Displaced comminuted nasal bone fracture. No other facial bone fractures. No acute cervical spine injury. Mild spondylosis of the cervical spine with disc disease at the C5-6 level. Electronically Signed   By: Elberta Fortisaniel  Boyle M.D.   On: 12/12/2015 21:56   Ct Maxillofacial Wo Contrast  Result Date: 12/12/2015 CLINICAL DATA:  Dementia. Fall today. Bruising to face and forehead. EXAM: CT HEAD WITHOUT CONTRAST CT MAXILLOFACIAL WITHOUT CONTRAST CT CERVICAL SPINE WITHOUT CONTRAST TECHNIQUE: Multidetector CT imaging of the head, cervical spine, and maxillofacial structures were performed using the standard protocol without intravenous contrast. Multiplanar CT image reconstructions of the cervical spine and maxillofacial structures were also generated. COMPARISON:  Head and cervical Spine CT 10/11/2015 FINDINGS: CT HEAD FINDINGS Brain: Ventricles, cisterns and other CSF spaces are within normal. There is minimal age related atrophic change. There is mild chronic ischemic microvascular disease. There is no mass, mass effect, shift of midline structures or acute hemorrhage. There is no evidence of acute infarction. Vascular: There is moderate calcified plaque over the cavernous segment of the internal carotid arteries. Skull: Displaced nasal bone fracture.  Frontal scalp contusion. Sinuses/Orbits: Orbits within normal. Opacification over the right frontal sinus and minimal opacification over the ethmoid sinus. Tiny air-fluid levels of the maxillary and left sphenoid sinuses. Few flecks of air adjacent the sella but no definite fracture visualized. CT MAXILLOFACIAL FINDINGS Osseous: Displaced slightly comminuted nasal bone fracture. Remaining bony structures within normal. Orbits: Normal and symmetric. Sinuses: Paranasal sinuses are well  developed and well aerated. There is moderate opacification over the right frontal sinus unchanged. Tiny air-fluid levels over the maxillary sinus and left sphenoid sinuses likely hemorrhagic debris. Mastoid air cells are clear. Soft tissues: Scalp contusion over the frontal region. CT CERVICAL SPINE FINDINGS Alignment: Within normal. Skull base and vertebrae: Mild spondylosis throughout the cervical spine. There is facet arthropathy and uncovertebral joint spurring. No acute fracture. Soft tissues and spinal canal: Within normal. Disc levels: Disc space narrowing with vacuum disc at the C5-6 level. Upper chest: Within normal. IMPRESSION: No acute intracranial findings. Frontal scalp contusion.  No fracture. Mild chronic ischemic microvascular disease and age related atrophy. Displaced comminuted nasal bone fracture. No other facial bone fractures. No acute cervical spine injury. Mild spondylosis of the cervical spine with disc disease at the C5-6 level. Electronically Signed   By: Elberta Fortisaniel  Boyle M.D.   On: 12/12/2015 21:56    Procedures .Epistaxis Management Date/Time: 12/13/2015 9:45 AM Performed by: Charlynne PanderYAO, Agamjot Kilgallon HSIENTA Authorized by: Charlynne PanderYAO, Zenaya Ulatowski HSIENTA   Consent:    Consent obtained:  Verbal   Consent given by:  Patient   Risks discussed:  Bleeding   Alternatives discussed:  No treatment Procedure details:    Treatment site:  L anterior   Treatment method:  Gel foam   Treatment complexity:  Limited   Treatment episode: initial   Post-procedure details:    Assessment:  Bleeding stopped   Patient tolerance of procedure:  Tolerated well, no immediate complications   (including critical care time)  Medications Ordered in ED Medications  oxymetazoline (AFRIN) 0.05 % nasal spray 1 spray (not administered)  cephALEXin (KEFLEX) capsule 500 mg (not administered)     Initial Impression / Assessment and Plan / ED Course  I have reviewed the triage vital signs and the nursing  notes.  Pertinent labs & imaging results that were available during my care of the patient were reviewed by me and considered in my medical decision making (see chart for details).  Clinical Course    Maleigh Bagot is a 82 y.o. female here with nose bleed. Had nasal bone fracture on CT yesterday and likely has bleeding from that. Not on blood thinners. Will try afrin and nasal packing.   9:45 AM I packed with surgicel. Bleeding controlled. Recommend afrin three times daily prn for 3 days, keflex for prophylaxis, ENT follow up to remove packing if it doesn't come out in several days.   Final Clinical Impressions(s) / ED Diagnoses   Final diagnoses:  None    New Prescriptions New Prescriptions   No medications on file     Charlynne Pander, MD 12/13/15 [phone]

## 2015-12-13 NOTE — ED Triage Notes (Signed)
Pt seen in ED last night for fall. Pt from [facility]. Sent back because nose bleeding per staff at nursing facility.

## 2015-12-13 NOTE — Discharge Instructions (Signed)
Take keflex three times daily for 7 days.   Leave the packing in, it will dissolve on its own. She may sneeze it out. If the nose is not bleeding, she doesn't need another packing.   Use afrin three times daily as needed for nose bleed for 3 days.   She has nasal bone fracture on CT yesterday. See ENT for follow up. If packing still in there in a week, packing will need to be removed by ENT   Return to ER if she has another nose bleed, headaches, vomiting, severe pain

## 2015-12-23 ENCOUNTER — Encounter: Payer: Self-pay | Admitting: Emergency Medicine

## 2015-12-23 ENCOUNTER — Emergency Department: Payer: Medicare Other

## 2015-12-23 ENCOUNTER — Emergency Department
Admission: EM | Admit: 2015-12-23 | Discharge: 2015-12-23 | Disposition: A | Discharge status: Home / Self Care | Payer: Medicare Other | Attending: Emergency Medicine | Admitting: Emergency Medicine

## 2015-12-23 DIAGNOSIS — W1839XA Other fall on same level, initial encounter: Secondary | ICD-10-CM | POA: Diagnosis not present

## 2015-12-23 DIAGNOSIS — Y999 Unspecified external cause status: Secondary | ICD-10-CM | POA: Diagnosis not present

## 2015-12-23 DIAGNOSIS — Y939 Activity, unspecified: Secondary | ICD-10-CM | POA: Insufficient documentation

## 2015-12-23 DIAGNOSIS — S0003XA Contusion of scalp, initial encounter: Secondary | ICD-10-CM | POA: Diagnosis not present

## 2015-12-23 DIAGNOSIS — F329 Major depressive disorder, single episode, unspecified: Secondary | ICD-10-CM | POA: Diagnosis not present

## 2015-12-23 DIAGNOSIS — Z7982 Long term (current) use of aspirin: Secondary | ICD-10-CM | POA: Diagnosis not present

## 2015-12-23 DIAGNOSIS — Z79899 Other long term (current) drug therapy: Secondary | ICD-10-CM | POA: Insufficient documentation

## 2015-12-23 DIAGNOSIS — W19XXXA Unspecified fall, initial encounter: Secondary | ICD-10-CM

## 2015-12-23 DIAGNOSIS — S0990XA Unspecified injury of head, initial encounter: Secondary | ICD-10-CM | POA: Diagnosis present

## 2015-12-23 DIAGNOSIS — Y9289 Other specified places as the place of occurrence of the external cause: Secondary | ICD-10-CM | POA: Insufficient documentation

## 2015-12-23 NOTE — ED Provider Notes (Signed)
[facility] Emergency Department Provider Note   ____________________________________________   First MD Initiated Contact with Patient 12/23/15 1342     (approximate)  I have reviewed the triage vital signs and the nursing notes.   HISTORY  Chief Complaint Fall   HPI Marie Vega is a 82 y.o. female with a history of dementia and frequent falls was presenting after a fall today. Per EMS, the patient was witnessed stumbling and falling backward and hitting the back of her head. There was no reported loss of consciousness. The patient is reported to be at her baseline mental status by EMS. The patient is unable to give a detailed history of the events. She is also unable to say if she is having any pain at this time.   Past Medical History:  Diagnosis Date  . Dementia   . Depression   . Syphilis     Patient Active Problem List   Diagnosis Date Noted  . Acute encephalopathy 10/13/2015    History reviewed. No pertinent surgical history.  Prior to Admission medications   Medication Sig Start Date End Date Taking? Authorizing Provider  acetaminophen (TYLENOL) 500 MG tablet Take 500 mg by mouth at bedtime as needed.    Historical Provider, MD  aspirin 81 MG chewable tablet Chew 81 mg by mouth daily.    Historical Provider, MD  cephALEXin (KEFLEX) 500 MG capsule Take 1 capsule (500 mg total) by mouth 2 (two) times daily. 10/16/15   Sharman Cheek, MD  donepezil (ARICEPT) 10 MG tablet Take 10 mg by mouth at bedtime.    Historical Provider, MD  feeding supplement, ENSURE ENLIVE, (ENSURE ENLIVE) LIQD Take 237 mLs by mouth 3 (three) times daily between meals. 10/14/15   Altamese Dilling, MD  oxymetazoline (AFRIN) 0.05 % nasal spray Place 2 sprays into left nostril 3 (three) times daily as needed for congestion. 12/13/15 12/12/16  Charlynne Pander, MD  sertraline (ZOLOFT) 50 MG tablet Take 50 mg by mouth at bedtime.    Historical Provider, MD     Allergies Review of patient's allergies indicates no known allergies.  Family History  Problem Relation Age of Onset  . Hypertension Other     Social History Social History  Substance Use Topics  . Smoking status: Never Smoker  . Smokeless tobacco: Never Used  . Alcohol use No    Review of Systems Level V caveat secondary to advanced dementia.  ____________________________________________   PHYSICAL EXAM:  VITAL SIGNS: ED Triage Vitals  Enc Vitals Group     BP 12/23/15 1341 (!) 155/67     Pulse Rate 12/23/15 1341 (!) 105     Resp 12/23/15 1341 18     Temp 12/23/15 1341 97.4 F (36.3 C)     Temp Source 12/23/15 1341 Oral     SpO2 12/23/15 1335 95 %     Weight 12/23/15 1342 110 lb (49.9 kg)     Height 12/23/15 1342 5\' 5"  (1.651 m)     Head Circumference --      Peak Flow --      Pain Score --      Pain Loc --      Pain Edu? --      Excl. in GC? --     Constitutional: Alert.  in no acute distress. Eyes: Conjunctivae are normal. PERRL. EOMI. Head: Ecchymosis periorbitally bilaterally which appears remote. No swelling or deformity to the face. Small hematoma to the left occiput which is 2  cm and round and slightly raised. There is no laceration. There is no depression or bogginess. Nose: No congestion/rhinnorhea. Mouth/Throat: Mucous membranes are moist.  Neck: No stridor.  No tenderness to the midline cervical spine. No deformity or step-off. Cardiovascular: Normal rate, regular rhythm. Grossly normal heart sounds.   Respiratory: Normal respiratory effort.  No retractions. Lungs CTAB. Gastrointestinal: Soft and nontender. No distention. No CVA tenderness. Musculoskeletal: No lower extremity tenderness nor edema.  No joint effusions. No tenderness to the pelvis bilaterally. Pelvis is stable. Able to range the bilateral hips passively without any resistance. No swelling or deformity to the bilateral lower or upper extremities. No abdominal ecchymosis or flank  ecchymosis. No tenderness to the midline cervical, thoracic or lumbar spines. No deformity or step-off. Neurologic:  Moves all 4 extremities equally. No facial droop. Skin:  Skin is warm, dry and intact. No rash noted. Psychiatric: Mood and affect are normal. Speech and behavior are normal.  ____________________________________________   LABS (all labs ordered are listed, but only abnormal results are displayed)  Labs Reviewed - No data to display ____________________________________________  EKG   ____________________________________________  RADIOLOGY  Princella Pellegrini [phone] (82 y.o. F) [id number]    Lab Results   None  Imaging Results     CT Head Wo Contrast (Final result)  Result time 12/23/15 14:39:20  Final result by Awilda Metro, MD (12/23/15 14:39:20)           Narrative:   CLINICAL DATA: Witnessed fall from standing. History of multiple falls and dementia.  EXAM: CT HEAD WITHOUT CONTRAST  CT CERVICAL SPINE WITHOUT CONTRAST  TECHNIQUE: Multidetector CT imaging of the head and cervical spine was performed following the standard protocol without intravenous contrast. Multiplanar CT image reconstructions of the cervical spine were also generated.  COMPARISON: CT HEAD and cervical spine December 12, 2015  FINDINGS: CT HEAD FINDINGS  BRAIN: The ventricles and sulci are normal for age. No intraparenchymal hemorrhage, mass effect nor midline shift. Patchy supratentorial white matter hypodensities most than expected for patient's age, though non-specific are most compatible with chronic small vessel ischemic disease. No acute large vascular territory infarcts. No abnormal extra-axial fluid collections. Basal cisterns are patent.  VASCULAR: Severe calcific atherosclerosis of the carotid siphons.  SKULL: No skull fracture. Multiple mildly impacted nasal bone fractures are unchanged. Small residual LEFT frontal scalp hematoma without subcutaneous  gas or radiopaque foreign bodies.  SINUSES/ORBITS: Lobulated posterior ethmoid mucosal thickening without paranasal sinus air-fluid levels. RIGHT frontal sinus mucosal retention cyst. Status post bilateral ocular lens implants. The included ocular globes and orbital contents are non-suspicious.  OTHER: None.  CT CERVICAL SPINE FINDINGS  ALIGNMENT: Maintained lordosis. Stable grade 1 C4-5 anterolisthesis, stable grade 1 C7-T1 anterolisthesis.  SKULL BASE AND VERTEBRAE: Cervical vertebral bodies and posterior elements are intact. Moderate to severe C5-6 disc height loss is unchanged, with uncovertebral hypertrophy compatible with degenerative disc. Multilevel moderate facet arthropathy. C1-2 articular sedation maintained with no moderate to severe arthropathy. Geographic lucencies C2 or unchanged.  DISC LEVELS: No significant canal stenosis. Moderate to severe LEFT C3-4 neural foraminal narrowing.  UPPER CHEST: Apical scarring.  OTHER: Soft tissues are nonacute. Mild calcific atherosclerosis of the carotid bulbs.  IMPRESSION: CT HEAD: No acute intracranial process. Negative CT HEAD for age.  Small residual LEFT frontal scalp hematoma. No skull fracture.  CT CERVICAL SPINE: No acute fracture. Stable grade 1 C4-5 and C7-T1 anterolisthesis on degenerative basis.   Electronically Signed By: Awilda Metro M.D. On: 12/23/2015 14:39  CT Cervical Spine Wo Contrast (Final result)  Result time 12/23/15 14:39:20  Final result by Awilda Metroourtnay Bloomer, MD (12/23/15 14:39:20)           Narrative:   CLINICAL DATA: Witnessed fall from standing. History of multiple falls and dementia.  EXAM: CT HEAD WITHOUT CONTRAST  CT CERVICAL SPINE WITHOUT CONTRAST  TECHNIQUE: Multidetector CT imaging of the head and cervical spine was performed following the standard protocol without intravenous contrast. Multiplanar CT image reconstructions of the cervical  spine were also generated.  COMPARISON: CT HEAD and cervical spine December 12, 2015  FINDINGS: CT HEAD FINDINGS  BRAIN: The ventricles and sulci are normal for age. No intraparenchymal hemorrhage, mass effect nor midline shift. Patchy supratentorial white matter hypodensities most than expected for patient's age, though non-specific are most compatible with chronic small vessel ischemic disease. No acute large vascular territory infarcts. No abnormal extra-axial fluid collections. Basal cisterns are patent.  VASCULAR: Severe calcific atherosclerosis of the carotid siphons.  SKULL: No skull fracture. Multiple mildly impacted nasal bone fractures are unchanged. Small residual LEFT frontal scalp hematoma without subcutaneous gas or radiopaque foreign bodies.  SINUSES/ORBITS: Lobulated posterior ethmoid mucosal thickening without paranasal sinus air-fluid levels. RIGHT frontal sinus mucosal retention cyst. Status post bilateral ocular lens implants. The included ocular globes and orbital contents are non-suspicious.  OTHER: None.  CT CERVICAL SPINE FINDINGS  ALIGNMENT: Maintained lordosis. Stable grade 1 C4-5 anterolisthesis, stable grade 1 C7-T1 anterolisthesis.  SKULL BASE AND VERTEBRAE: Cervical vertebral bodies and posterior elements are intact. Moderate to severe C5-6 disc height loss is unchanged, with uncovertebral hypertrophy compatible with degenerative disc. Multilevel moderate facet arthropathy. C1-2 articular sedation maintained with no moderate to severe arthropathy. Geographic lucencies C2 or unchanged.  DISC LEVELS: No significant canal stenosis. Moderate to severe LEFT C3-4 neural foraminal narrowing.  UPPER CHEST: Apical scarring.  OTHER: Soft tissues are nonacute. Mild calcific atherosclerosis of the carotid bulbs.  IMPRESSION: CT HEAD: No acute intracranial process. Negative CT HEAD for age.  Small residual LEFT frontal scalp hematoma. No skull  fracture.  CT CERVICAL SPINE: No acute fracture. Stable grade 1 C4-5 and C7-T1 anterolisthesis on degenerative basis.   Electronically Signed By: Awilda Metroourtnay Bloomer M.D. On: 12/23/2015 14:39            DG Chest 1 View (Final result)  Result time 12/23/15 14:17:29  Final result by Hassan BucklerJason Alexander Poff, MD (12/23/15 14:17:29)           Narrative:   CLINICAL DATA: Fall  EXAM: CHEST 1 VIEW  COMPARISON: 11/24/2015 chest radiograph.  FINDINGS: Stable cardiomediastinal silhouette with normal heart size and aortic atherosclerosis. No pneumothorax. No pleural effusion. Lungs appear clear, with no acute consolidative airspace disease and no pulmonary edema. No displaced fractures. Soft tissue calcifications adjacent to the left greater tuberosity suggests calcific tendinopathy.  IMPRESSION: No active cardiopulmonary disease. Aortic atherosclerosis.   Electronically Signed By: Delbert PhenixJason A Poff M.D. On: 12/23/2015 14:17            DG Pelvis 1-2 Views (Final result)  Result time 12/23/15 14:18:50  Final result by Hassan BucklerJason Alexander Poff, MD (12/23/15 14:18:50)           Narrative:   CLINICAL DATA: Fall.  EXAM: PELVIS - 1-2 VIEW  COMPARISON: 11/24/2015 pelvic radiograph.  FINDINGS: No pelvic fracture or diastasis. No evidence of hip dislocation on this single frontal view. Mild degenerative changes in the weight-bearing portions of both hip joints. Spondylosis in the visualized lower lumbar spine.  IMPRESSION: No fracture .   Electronically Signed By: Delbert PhenixJason A Poff M.D. On: 12/23/2015 14:18            ____________________________________________   PROCEDURES  Procedure(s) performed:   Procedures  Critical Care performed:   ____________________________________________   INITIAL IMPRESSION / ASSESSMENT AND PLAN / ED COURSE  Pertinent labs & imaging results that were available during my care of the patient were reviewed by  me and considered in my medical decision making (see chart for details).  Patient with witnessed mechanical fall. We'll make sure she has no emergent injuries on her imaging. Likely discharge to memory care as long as the imaging does not show any acute pathology.  Clinical Course   ----------------------------------------- 4:23 PM on 12/23/2015 -----------------------------------------  Patient without any acute findings on imaging. Attempted to call the patient's emergency contact which is her son both at home and at work. As able to leave a message at the home phone number. The work number had a busy signal in the cell number is only listed with 8 digits so I was unable to call this contact number. I was unable to give a course in the emergency department son as well as the plan for discharge. However, the patient had a witnessed fall and has frequent falls. No acute injury. Will be discharged home at this time.  ____________________________________________   FINAL CLINICAL IMPRESSION(S) / ED DIAGNOSES  All. Cephalohematoma.    NEW MEDICATIONS STARTED DURING THIS VISIT:  New Prescriptions   No medications on file     Note:  This document was prepared using Dragon voice recognition software and may include unintentional dictation errors.    Myrna Blazeravid Matthew Schaevitz, MD 12/23/15 [phone]

## 2015-12-23 NOTE — ED Notes (Signed)
Pt attempting to get up from the stretcher multiple times and reoriented.. Pt moved to in front of the nurses station for pt safety.Marland Kitchen. EMS called for transport back to [facility].

## 2015-12-23 NOTE — ED Triage Notes (Signed)
Per EMS patient from [facility]. Pt has h/s of dementia and is alerted to self only. Witnessed fall from standing and pt has h/s of multiple falls and bruises from previous falls on L eye and R cheek.

## 2016-01-04 ENCOUNTER — Emergency Department
Admission: EM | Admit: 2016-01-04 | Discharge: 2016-01-04 | Disposition: A | Discharge status: Home / Self Care | Payer: Medicare Other | Attending: Emergency Medicine | Admitting: Emergency Medicine

## 2016-01-04 DIAGNOSIS — Z791 Long term (current) use of non-steroidal anti-inflammatories (NSAID): Secondary | ICD-10-CM | POA: Diagnosis not present

## 2016-01-04 DIAGNOSIS — Z7982 Long term (current) use of aspirin: Secondary | ICD-10-CM | POA: Diagnosis not present

## 2016-01-04 DIAGNOSIS — Y92129 Unspecified place in nursing home as the place of occurrence of the external cause: Secondary | ICD-10-CM | POA: Insufficient documentation

## 2016-01-04 DIAGNOSIS — W1839XA Other fall on same level, initial encounter: Secondary | ICD-10-CM | POA: Diagnosis not present

## 2016-01-04 DIAGNOSIS — Z043 Encounter for examination and observation following other accident: Secondary | ICD-10-CM | POA: Diagnosis not present

## 2016-01-04 DIAGNOSIS — W19XXXA Unspecified fall, initial encounter: Secondary | ICD-10-CM

## 2016-01-04 DIAGNOSIS — Y999 Unspecified external cause status: Secondary | ICD-10-CM | POA: Diagnosis not present

## 2016-01-04 DIAGNOSIS — Y939 Activity, unspecified: Secondary | ICD-10-CM | POA: Insufficient documentation

## 2016-01-04 LAB — BASIC METABOLIC PANEL
Anion gap: 7 (ref 5–15)
BUN: 19 mg/dL (ref 6–20)
CHLORIDE: 105 mmol/L (ref 101–111)
CO2: 30 mmol/L (ref 22–32)
CREATININE: 0.69 mg/dL (ref 0.44–1.00)
Calcium: 9.4 mg/dL (ref 8.9–10.3)
GFR calc Af Amer: >60 mL/min (ref >60)
GFR calc non Af Amer: >60 mL/min (ref >60)
GLUCOSE: 126 mg/dL — AB (ref 65–99)
POTASSIUM: 4.5 mmol/L (ref 3.5–5.1)
Sodium: 142 mmol/L (ref 135–145)

## 2016-01-04 LAB — CBC
HEMATOCRIT: 36.5 % (ref 35.0–47.0)
Hemoglobin: 12.1 g/dL (ref 12.0–16.0)
MCH: 28.4 pg (ref 26.0–34.0)
MCHC: 33 g/dL (ref 32.0–36.0)
MCV: 86 fL (ref 80.0–100.0)
Platelets: 293 10*3/uL (ref 150–440)
RBC: 4.25 MIL/uL (ref 3.80–5.20)
RDW: 15.1 % — AB (ref 11.5–14.5)
WBC: 7 10*3/uL (ref 3.6–11.0)

## 2016-01-04 NOTE — ED Triage Notes (Signed)
Pt arrived via ems for a witnessed fall at [facility] - pt was standing and then just fell backwards - she came from the secured unit - pt has no obvious injury - pt is at baseline at this time

## 2016-01-04 NOTE — ED Notes (Addendum)
Pt arrived via ems for a witnessed fall at [facility] - pt was standing and then just fell backwards - she came from the secured unit - pt has no obvious injury - pt is at baseline at this time - per EMS, [facility] reported that pt falls several times a week

## 2016-01-04 NOTE — ED Notes (Signed)
Secretary notified to contact ems for transportation back to [facility] - [facility] contacted and report given to memory care unit

## 2016-01-04 NOTE — ED Notes (Signed)
Pt ambulated down hall without difficulty 

## 2016-01-04 NOTE — ED Provider Notes (Signed)
[facility] Emergency Department Provider Note  Time seen: 3:34 PM  I have reviewed the triage vital signs and the nursing notes.   HISTORY  Chief Complaint Fall    HPI Marie PellegriniMary Vega is a 82 y.o. female with a past medical history of dementia, depression who presents the emergency department after a fall. According to EMS reported the patient was at her nursing facility she had a mechanical fall backwards falling to the ground. No LOC. No vomiting. Patient has no complaints in the emergency department. Here the patient is lying in bed, she does not recall the fall but states she is not hurting anywhere. Patient is acting appropriate in the emergency department.  Past Medical History:  Diagnosis Date  . Dementia   . Depression   . Syphilis     Patient Active Problem List   Diagnosis Date Noted  . Acute encephalopathy 10/13/2015    History reviewed. No pertinent surgical history.  Prior to Admission medications   Medication Sig Start Date End Date Taking? Authorizing Provider  acetaminophen (TYLENOL) 500 MG tablet Take 500 mg by mouth at bedtime as needed.    Historical Provider, MD  aspirin 81 MG chewable tablet Chew 81 mg by mouth daily.    Historical Provider, MD  benzocaine (ORAJEL) 10 % mucosal gel Use as directed 1 application in the mouth or throat 3 (three) times daily as needed for mouth pain.    Historical Provider, MD  busPIRone (BUSPAR) 5 MG tablet Take 5 mg by mouth 2 (two) times daily.    Historical Provider, MD  donepezil (ARICEPT) 10 MG tablet Take 10 mg by mouth at bedtime.    Historical Provider, MD  feeding supplement, ENSURE ENLIVE, (ENSURE ENLIVE) LIQD Take 237 mLs by mouth 3 (three) times daily between meals. 10/14/15   Altamese DillingVaibhavkumar Vachhani, MD  ibuprofen (ADVIL,MOTRIN) 400 MG tablet Take 400 mg by mouth 2 (two) times daily as needed.    Historical Provider, MD  Melatonin 3 MG TABS Take 1 tablet by mouth at bedtime.    Historical  Provider, MD  oxymetazoline (AFRIN) 0.05 % nasal spray Place 2 sprays into left nostril 3 (three) times daily as needed for congestion. 12/13/15 12/12/16  Charlynne Panderavid Hsienta Yao, MD  sertraline (ZOLOFT) 100 MG tablet Take 100 mg by mouth at bedtime. At 8 pm.    Historical Provider, MD    No Known Allergies  Family History  Problem Relation Age of Onset  . Hypertension Other     Social History Social History  Substance Use Topics  . Smoking status: Never Smoker  . Smokeless tobacco: Never Used  . Alcohol use No    Review of Systems Constitutional: Negative for fever. Cardiovascular: Negative for chest pain. Gastrointestinal: Negative for abdominal pain Musculoskeletal: Denies neck or back pain. Denies extremity pain. Neurological: Negative for headache 10-point ROS otherwise negative.  ____________________________________________   PHYSICAL EXAM:  VITAL SIGNS: ED Triage Vitals [01/04/16 1429]  Enc Vitals Group     BP (!) 148/50     Pulse Rate 70     Resp 16     Temp 97.6 F (36.4 C)     Temp Source Oral     SpO2 98 %     Weight 100 lb (45.4 kg)     Height 5\' 6"  (1.676 m)     Head Circumference      Peak Flow      Pain Score      Pain Loc  Pain Edu?      Excl. in GC?     Constitutional: Alert. Well appearing and in no distress. Eyes: Normal exam ENT   Head: Normocephalic and atraumatic   Mouth/Throat: Mucous membranes are moist. Cardiovascular: Normal rate, regular rhythm.  Respiratory: Normal respiratory effort without tachypnea nor retractions. Breath sounds are clear  Gastrointestinal: Soft and nontender. Musculoskeletal: Nontender with normal range of motion in all extremities. No lower extremity tenderness. Pelvis is stable. No C-spine T-spine or L-spine tenderness. Ambulates without difficulty. Neurologic:  Normal speech and language. No gross focal neurologic deficits Skin:  Skin is warm, dry and intact.  Psychiatric: Mood and affect are  normal.   ____________________________________________   INITIAL IMPRESSION / ASSESSMENT AND PLAN / ED COURSE  Pertinent labs & imaging results that were available during my care of the patient were reviewed by me and considered in my medical decision making (see chart for details).  The patient presents the emergency department after a mechanical fall her nursing facility. Patient appears very well, no traumatic findings on examination. Patient is able to ambulate very well on her own. As the patient appears well with no apparent deficits on examination, no apparent trauma on examination we will discharge the patient back to her nursing facility. Basic labs were performed upon arrival are within normal limits.  ____________________________________________   FINAL CLINICAL IMPRESSION(S) / ED DIAGNOSES  Marie RossFall    Marie Swander, MD 01/04/16 1537

## 2016-01-04 NOTE — ED Notes (Signed)
[facility] states they have no transportation to come and get pt and that ems will need to be contacted

## 2016-01-07 ENCOUNTER — Emergency Department: Payer: Medicare Other

## 2016-01-07 ENCOUNTER — Encounter: Payer: Self-pay | Admitting: Emergency Medicine

## 2016-01-07 ENCOUNTER — Emergency Department
Admission: EM | Admit: 2016-01-07 | Discharge: 2016-01-07 | Disposition: A | Discharge status: Home / Self Care | Payer: Medicare Other | Attending: Student in an Organized Health Care Education/Training Program | Admitting: Student in an Organized Health Care Education/Training Program

## 2016-01-07 DIAGNOSIS — S0003XA Contusion of scalp, initial encounter: Secondary | ICD-10-CM | POA: Insufficient documentation

## 2016-01-07 DIAGNOSIS — W1839XA Other fall on same level, initial encounter: Secondary | ICD-10-CM | POA: Diagnosis not present

## 2016-01-07 DIAGNOSIS — Y939 Activity, unspecified: Secondary | ICD-10-CM | POA: Insufficient documentation

## 2016-01-07 DIAGNOSIS — M25552 Pain in left hip: Secondary | ICD-10-CM | POA: Insufficient documentation

## 2016-01-07 DIAGNOSIS — Z79899 Other long term (current) drug therapy: Secondary | ICD-10-CM | POA: Diagnosis not present

## 2016-01-07 DIAGNOSIS — W19XXXA Unspecified fall, initial encounter: Secondary | ICD-10-CM

## 2016-01-07 DIAGNOSIS — Z791 Long term (current) use of non-steroidal anti-inflammatories (NSAID): Secondary | ICD-10-CM | POA: Insufficient documentation

## 2016-01-07 DIAGNOSIS — Y999 Unspecified external cause status: Secondary | ICD-10-CM | POA: Insufficient documentation

## 2016-01-07 DIAGNOSIS — S0990XA Unspecified injury of head, initial encounter: Secondary | ICD-10-CM | POA: Diagnosis present

## 2016-01-07 DIAGNOSIS — Z7982 Long term (current) use of aspirin: Secondary | ICD-10-CM | POA: Diagnosis not present

## 2016-01-07 DIAGNOSIS — Y929 Unspecified place or not applicable: Secondary | ICD-10-CM | POA: Diagnosis not present

## 2016-01-07 NOTE — ED Provider Notes (Signed)
[facility] Emergency Department Provider Note    First MD Initiated Contact with Patient 01/07/16 2020     (approximate)  I have reviewed the triage vital signs and the nursing notes.   HISTORY  Chief Complaint Fall and Hip Pain    HPI Marie Vega is a 82 y.o. female who presents for the third time this month with a mechanical fall from ground-level height fall and backwards and hitting the back of her head. Patient arrives to the ER in no acute distress. She takes a baby aspirin. Complaining of pain to the back of her head and neck as well as her left hip. Otherwise denies any other back pain abdominal pain or chest pain. She does have a history of dementia. By EMS report patient is at baseline. No other complaints at this time.   Past Medical History:  Diagnosis Date  . Dementia   . Depression   . Syphilis    Family History  Problem Relation Age of Onset  . Hypertension Other    History reviewed. No pertinent surgical history. Patient Active Problem List   Diagnosis Date Noted  . Acute encephalopathy 10/13/2015      Prior to Admission medications   Medication Sig Start Date End Date Taking? Authorizing Provider  acetaminophen (TYLENOL) 500 MG tablet Take 500 mg by mouth at bedtime as needed.    Historical Provider, MD  aspirin 81 MG chewable tablet Chew 81 mg by mouth daily.    Historical Provider, MD  benzocaine (ORAJEL) 10 % mucosal gel Use as directed 1 application in the mouth or throat 3 (three) times daily as needed for mouth pain.    Historical Provider, MD  busPIRone (BUSPAR) 5 MG tablet Take 5 mg by mouth 2 (two) times daily.    Historical Provider, MD  donepezil (ARICEPT) 10 MG tablet Take 10 mg by mouth at bedtime.    Historical Provider, MD  feeding supplement, ENSURE ENLIVE, (ENSURE ENLIVE) LIQD Take 237 mLs by mouth 3 (three) times daily between meals. 10/14/15   Altamese DillingVaibhavkumar Vachhani, MD  ibuprofen (ADVIL,MOTRIN) 400 MG tablet  Take 400 mg by mouth 2 (two) times daily as needed.    Historical Provider, MD  Melatonin 3 MG TABS Take 1 tablet by mouth at bedtime.    Historical Provider, MD  oxymetazoline (AFRIN) 0.05 % nasal spray Place 2 sprays into left nostril 3 (three) times daily as needed for congestion. 12/13/15 12/12/16  Charlynne Panderavid Hsienta Yao, MD  sertraline (ZOLOFT) 100 MG tablet Take 100 mg by mouth at bedtime. At 8 pm.    Historical Provider, MD    Allergies Patient has no known allergies.    Social History Social History  Substance Use Topics  . Smoking status: Never Smoker  . Smokeless tobacco: Never Used  . Alcohol use No    Review of Systems Patient denies headaches, rhinorrhea, blurry vision, numbness, shortness of breath, chest pain, edema, cough, abdominal pain, nausea, vomiting, diarrhea, dysuria, fevers, rashes or hallucinations unless otherwise stated above in HPI. ____________________________________________   PHYSICAL EXAM:  VITAL SIGNS: Vitals:   01/07/16 2020  BP: (!) 182/81  Pulse: 71  Resp: 20  Temp: 98.9 F (37.2 C)    Constitutional: elderly chronically ill female, Alert and in no acute distress. Eyes: Conjunctivae are normal. PERRL. EOMI. Head: Atraumatic. Nose: No congestion/rhinnorhea. Mouth/Throat: Mucous membranes are moist.  Oropharynx non-erythematous. Neck: No stridor. Painless ROM. No cervical spine tenderness to palpation Hematological/Lymphatic/Immunilogical: No cervical lymphadenopathy.  Cardiovascular: Normal rate, regular rhythm. Grossly normal heart sounds.  Good peripheral circulation. Respiratory: Normal respiratory effort.  No retractions. Lungs CTAB. Gastrointestinal: Soft and nontender. No distention. No abdominal bruits. No CVA tenderness. Musculoskeletal: no pain with log roll, no deformity of all four extremitiesNo lower extremity tenderness nor edema.  No joint effusions. Neurologic:  Normal speech and language. No gross focal neurologic deficits  are appreciated. No gait instability. Skin:  Skin is warm, dry and intact. No rash noted. Psychiatric: Mood and affect are normal. Speech and behavior are normal.  ____________________________________________   LABS (all labs ordered are listed, but only abnormal results are displayed)  No results found for this or any previous visit (from the past 24 hour(s)). ____________________________________________  EKG My review and personal interpretation at Time: 20:21   Indication: htn  Rate: 60  Rhythm: sinus Axis: normal Other: non specific st changes ____________________________________________  RADIOLOGY  CN- intact.  No facial droop, Normal FNF.  Normal heel to shin.  Sensation intact bilaterally. Normal speech and language. No gross focal neurologic deficits are appreciated. No gait instability.  ____________________________________________   PROCEDURES  Procedure(s) performed: none Procedures    Critical Care performed: no ____________________________________________   INITIAL IMPRESSION / ASSESSMENT AND PLAN / ED COURSE  Pertinent labs & imaging results that were available during my care of the patient were reviewed by me and considered in my medical decision making (see chart for details).  DDX: sah, sdh, edh, fracture, contusion, soft tissue injury, viscous injury, concussion, hemorrhage   Marie Vega is a 82 y.o. who presents to the ED with above complaints after mechanical fall. Patient is AFVSS in ED. Exam as above. Given current presentation have considered the above differential.  Radial graphic imaging ordered to evaluate for acute somatic injury. Patient otherwise well appearing and in no acute distress. Patient had CT imaging shows no evidence of acute traumatic injury. There is no evidence of cervical spine fracture. His C-spine cleared at bedside. No evidence of chest or abdominal trauma. X-ray of the hip shows no evidence of fracture. Patient otherwise  clinically stable and appropriate for discharge back to nursing facility.  Have discussed with the patient and available family all diagnostics and treatments performed thus far and all questions were answered to the best of my ability. The patient demonstrates understanding and agreement with plan.   Clinical Course      ____________________________________________   FINAL CLINICAL IMPRESSION(S) / ED DIAGNOSES  Final diagnoses:  Fall from standing, initial encounter  Scalp hematoma, initial encounter  Pain of left hip joint      NEW MEDICATIONS STARTED DURING THIS VISIT:  New Prescriptions   No medications on file     Note:  This document was prepared using Dragon voice recognition software and may include unintentional dictation errors.    Willy EddyPatrick Satin Boal, MD 01/07/16 2116

## 2016-01-07 NOTE — ED Triage Notes (Signed)
Pt to ED via EMS from [facility] after fall tonight.  EMS states patient had witnessed fall backwards and hit back of head on floor.  Pt c/o left hip pain as well.  EMS states patient not on blood thinners.  Pt presents with c-collar and mask in place due to patient spitting.

## 2016-01-18 ENCOUNTER — Emergency Department
Admission: EM | Admit: 2016-01-18 | Discharge: 2016-01-18 | Disposition: A | Discharge status: Home / Self Care | Payer: Medicare Other | Attending: Emergency Medicine | Admitting: Emergency Medicine

## 2016-01-18 DIAGNOSIS — Z7982 Long term (current) use of aspirin: Secondary | ICD-10-CM | POA: Insufficient documentation

## 2016-01-18 DIAGNOSIS — Y939 Activity, unspecified: Secondary | ICD-10-CM | POA: Insufficient documentation

## 2016-01-18 DIAGNOSIS — Z79899 Other long term (current) drug therapy: Secondary | ICD-10-CM | POA: Diagnosis not present

## 2016-01-18 DIAGNOSIS — R531 Weakness: Secondary | ICD-10-CM | POA: Diagnosis not present

## 2016-01-18 DIAGNOSIS — Z791 Long term (current) use of non-steroidal anti-inflammatories (NSAID): Secondary | ICD-10-CM | POA: Insufficient documentation

## 2016-01-18 DIAGNOSIS — Y999 Unspecified external cause status: Secondary | ICD-10-CM | POA: Insufficient documentation

## 2016-01-18 DIAGNOSIS — T148XXA Other injury of unspecified body region, initial encounter: Secondary | ICD-10-CM | POA: Diagnosis present

## 2016-01-18 DIAGNOSIS — Y929 Unspecified place or not applicable: Secondary | ICD-10-CM | POA: Diagnosis not present

## 2016-01-18 DIAGNOSIS — W19XXXA Unspecified fall, initial encounter: Secondary | ICD-10-CM | POA: Insufficient documentation

## 2016-01-18 LAB — COMPREHENSIVE METABOLIC PANEL
ALK PHOS: 67 U/L (ref 38–126)
ALT: 13 U/L — AB (ref 14–54)
AST: 23 U/L (ref 15–41)
Albumin: 3.7 g/dL (ref 3.5–5.0)
Anion gap: 7 (ref 5–15)
BUN: 17 mg/dL (ref 6–20)
CALCIUM: 8.8 mg/dL — AB (ref 8.9–10.3)
CO2: 31 mmol/L (ref 22–32)
CREATININE: 0.87 mg/dL (ref 0.44–1.00)
Chloride: 102 mmol/L (ref 101–111)
GFR calc non Af Amer: 55 mL/min — ABNORMAL LOW (ref >60)
Glucose, Bld: 201 mg/dL — ABNORMAL HIGH (ref 65–99)
Potassium: 4.1 mmol/L (ref 3.5–5.1)
SODIUM: 140 mmol/L (ref 135–145)
Total Bilirubin: 0.6 mg/dL (ref 0.3–1.2)
Total Protein: 6.9 g/dL (ref 6.5–8.1)

## 2016-01-18 LAB — URINALYSIS COMPLETE WITH MICROSCOPIC (ARMC ONLY)
BACTERIA UA: NONE SEEN
Bilirubin Urine: NEGATIVE
Glucose, UA: NEGATIVE mg/dL
Hgb urine dipstick: NEGATIVE
KETONES UR: NEGATIVE mg/dL
Leukocytes, UA: NEGATIVE
Nitrite: NEGATIVE
PH: 6 (ref 5.0–8.0)
PROTEIN: NEGATIVE mg/dL
Specific Gravity, Urine: 1.014 (ref 1.005–1.030)

## 2016-01-18 LAB — CBC WITH DIFFERENTIAL/PLATELET
BASOS ABS: 0 10*3/uL (ref 0–0.1)
Basophils Relative: 1 %
EOS ABS: 0.1 10*3/uL (ref 0–0.7)
Eosinophils Relative: 1 %
HCT: 38.5 % (ref 35.0–47.0)
HEMOGLOBIN: 12.5 g/dL (ref 12.0–16.0)
LYMPHS ABS: 1.4 10*3/uL (ref 1.0–3.6)
Lymphocytes Relative: 20 %
MCH: 28.3 pg (ref 26.0–34.0)
MCHC: 32.5 g/dL (ref 32.0–36.0)
MCV: 87.1 fL (ref 80.0–100.0)
Monocytes Absolute: 0.6 10*3/uL (ref 0.2–0.9)
Monocytes Relative: 8 %
NEUTROS PCT: 70 %
Neutro Abs: 4.8 10*3/uL (ref 1.4–6.5)
Platelets: 249 10*3/uL (ref 150–440)
RBC: 4.42 MIL/uL (ref 3.80–5.20)
RDW: 15.4 % — ABNORMAL HIGH (ref 11.5–14.5)
WBC: 6.9 10*3/uL (ref 3.6–11.0)

## 2016-01-18 LAB — TROPONIN I: Troponin I: <0.03 ng/mL (ref <0.03)

## 2016-01-18 NOTE — ED Notes (Signed)
Pt awaiting transport back to [facility]

## 2016-01-18 NOTE — ED Triage Notes (Signed)
Pt from [facility] via EMS, reports unwitnessed fall. Pt at baseline per staff

## 2016-01-18 NOTE — ED Notes (Signed)
Pt moved into room 9 to collect urine and perform an EKG.

## 2016-01-18 NOTE — ED Notes (Signed)
Pt unable to sign  

## 2016-01-18 NOTE — ED Provider Notes (Signed)
[facility] Emergency Department Provider Note   L5 caveat: Review of systems and history is limited by dementia     Time seen: ----------------------------------------- 2:23 PM on 01/18/2016 -----------------------------------------    I have reviewed the triage vital signs and the nursing notes.   HISTORY  Chief Complaint Fall    HPI Marie PellegriniMary Vega is a 82 y.o. female who presents to ER after an unwitnessed fall. Initially there were reports of her being more altered than normal. She is unable to provide review of systems or report.   Past Medical History:  Diagnosis Date  . Dementia   . Depression   . Syphilis     Patient Active Problem List   Diagnosis Date Noted  . Acute encephalopathy 10/13/2015    History reviewed. No pertinent surgical history.  Allergies Patient has no known allergies.  Social History Social History  Substance Use Topics  . Smoking status: Never Smoker  . Smokeless tobacco: Never Used  . Alcohol use No    Review of Systems Unknown at this time, reported history of dementia  ____________________________________________   PHYSICAL EXAM:  VITAL SIGNS: ED Triage Vitals  Enc Vitals Group     BP 01/18/16 1405 (!) 121/44     Pulse Rate 01/18/16 1405 67     Resp 01/18/16 1405 16     Temp 01/18/16 1420 98 F (36.7 C)     Temp Source 01/18/16 1420 Oral     SpO2 01/18/16 1405 97 %     Weight 01/18/16 1406 111 lb (50.3 kg)     Height 01/18/16 1406 5\' 6"  (1.676 m)     Head Circumference --      Peak Flow --      Pain Score --      Pain Loc --      Pain Edu? --      Excl. in GC? --     Constitutional: No acute distress ENT   Head: Normocephalic and atraumatic.   Nose: No congestion/rhinnorhea.   Mouth/Throat: Mucous membranes are moist.   Neck: No stridor. Cardiovascular: Normal rate, regular rhythm. Systolic murmur Respiratory: Normal respiratory effort without tachypnea nor retractions.  Breath sounds are clear and equal bilaterally. No wheezes/rales/rhonchi. Gastrointestinal: Soft and nontender. Normal bowel sounds Musculoskeletal: Nontender with normal range of motion in all extremities. No lower extremity tenderness nor edema. Neurologic:   No gross focal neurologic deficits are appreciated.  Skin:  Skin is warm, dry and intact. No rash noted. ____________________________________________  ED COURSE:  Pertinent labs & imaging results that were available during my care of the patient were reviewed by me and considered in my medical decision making (see chart for details). Clinical Course   Patient presents the ER in no distress from recent fall. We will assess with labs but likely will not reimage. She has had 2 rounds of CT imaging this month already.  Procedures ____________________________________________   LABS (pertinent positives/negatives)  Labs Reviewed  CBC WITH DIFFERENTIAL/PLATELET - Abnormal; Notable for the following:       Result Value   RDW 15.4 (*)    All other components within normal limits  COMPREHENSIVE METABOLIC PANEL - Abnormal; Notable for the following:    Glucose, Bld 201 (*)    Calcium 8.8 (*)    ALT 13 (*)    GFR calc non Af Amer 55 (*)    All other components within normal limits  URINALYSIS COMPLETEWITH MICROSCOPIC ([facility] ONLY) - Abnormal; Notable for  the following:    Color, Urine YELLOW (*)    APPearance CLEAR (*)    Squamous Epithelial / LPF 0-5 (*)    All other components within normal limits  TROPONIN I   ____________________________________________  FINAL ASSESSMENT AND PLAN  Fall, altered mental status  Plan: Patient with labs and imaging as dictated above. Patient is in no acute distress, states she is ready to return to the nursing home. She appears to be back to her baseline, no acute abnormalities are identified here.   [name], [name], MD   Note: This dictation was prepared with Dragon dictation. Any  transcriptional errors that result from this process are unintentional    Emily FilbertJonathan E Kensi Karr, MD 01/18/16 1530

## 2016-02-15 ENCOUNTER — Emergency Department: Payer: Medicare Other

## 2016-02-15 ENCOUNTER — Emergency Department
Admission: EM | Admit: 2016-02-15 | Discharge: 2016-02-15 | Disposition: A | Discharge status: Home / Self Care | Payer: Medicare Other | Attending: Student in an Organized Health Care Education/Training Program | Admitting: Student in an Organized Health Care Education/Training Program

## 2016-02-15 ENCOUNTER — Encounter: Payer: Self-pay | Admitting: Emergency Medicine

## 2016-02-15 DIAGNOSIS — S0003XA Contusion of scalp, initial encounter: Secondary | ICD-10-CM | POA: Insufficient documentation

## 2016-02-15 DIAGNOSIS — Z79899 Other long term (current) drug therapy: Secondary | ICD-10-CM | POA: Diagnosis not present

## 2016-02-15 DIAGNOSIS — Z23 Encounter for immunization: Secondary | ICD-10-CM | POA: Insufficient documentation

## 2016-02-15 DIAGNOSIS — Z7982 Long term (current) use of aspirin: Secondary | ICD-10-CM | POA: Diagnosis not present

## 2016-02-15 DIAGNOSIS — W19XXXA Unspecified fall, initial encounter: Secondary | ICD-10-CM

## 2016-02-15 DIAGNOSIS — Y999 Unspecified external cause status: Secondary | ICD-10-CM | POA: Diagnosis not present

## 2016-02-15 DIAGNOSIS — Y939 Activity, unspecified: Secondary | ICD-10-CM | POA: Diagnosis not present

## 2016-02-15 DIAGNOSIS — Y929 Unspecified place or not applicable: Secondary | ICD-10-CM | POA: Insufficient documentation

## 2016-02-15 DIAGNOSIS — S0990XA Unspecified injury of head, initial encounter: Secondary | ICD-10-CM | POA: Diagnosis present

## 2016-02-15 DIAGNOSIS — W1839XA Other fall on same level, initial encounter: Secondary | ICD-10-CM | POA: Diagnosis not present

## 2016-02-15 MED ORDER — TETANUS-DIPHTH-ACELL PERTUSSIS 5-2.5-18.5 LF-MCG/0.5 IM SUSP: 0.5000 mL | Freq: Once | INTRAMUSCULAR | Status: DC

## 2016-02-15 MED ORDER — TETANUS-DIPHTH-ACELL PERTUSSIS 5-2.5-18.5 LF-MCG/0.5 IM SUSP
0.5000 mL | Freq: Once | INTRAMUSCULAR | Status: DC
  Filled 2016-02-15: qty 0.5

## 2016-02-15 NOTE — ED Notes (Signed)
Report given Kendell Baneroy Med tech

## 2016-02-15 NOTE — ED Notes (Signed)
Pt refused tetanus shot; will try again later.

## 2016-02-15 NOTE — ED Triage Notes (Signed)
Pt via ems after unwitnessed fall in alz unit. Pt sleepy but will respond to voice. Pt has hematoma and small laceration with controlled bleeding to back of head. Pt does not know where she hurts.

## 2016-02-15 NOTE — Discharge Instructions (Signed)
Place bacitracin or Neosporin to left scalp twice daily. Hold pressure for any bleeding. Return for any other concerns or questions.

## 2016-02-15 NOTE — ED Provider Notes (Signed)
[facility] Emergency Department Provider Note    First MD Initiated Contact with Patient 02/15/16 1731     (approximate)  I have reviewed the triage vital signs and the nursing notes.   HISTORY  Chief Complaint Fall  Level V caveat: Dementia  HPI Marie Vega is a 82 y.o. female patient with severe dementia presents after unwitnessed fall from standing with injury to the posterior scalp with small amount of bleeding. Patient otherwise arrives acting at baseline.  Admits to pain to the back of her head but denies any other discomfort.   Past Medical History:  Diagnosis Date  . Dementia   . Depression   . Syphilis    Family History  Problem Relation Age of Onset  . Hypertension Other    No past surgical history on file. Patient Active Problem List   Diagnosis Date Noted  . Acute encephalopathy 10/13/2015      Prior to Admission medications   Medication Sig Start Date End Date Taking? Authorizing Provider  acetaminophen (TYLENOL) 500 MG tablet Take 500 mg by mouth at bedtime as needed for mild pain.     Historical Provider, MD  aspirin 81 MG chewable tablet Chew 81 mg by mouth daily.    Historical Provider, MD  benzocaine (ORAJEL) 10 % mucosal gel Use as directed 1 application in the mouth or throat 3 (three) times daily as needed for mouth pain.    Historical Provider, MD  busPIRone (BUSPAR) 5 MG tablet Take 5 mg by mouth 2 (two) times daily.    Historical Provider, MD  feeding supplement, ENSURE ENLIVE, (ENSURE ENLIVE) LIQD Take 237 mLs by mouth 3 (three) times daily between meals. 10/14/15   Altamese DillingVaibhavkumar Vachhani, MD  ibuprofen (ADVIL,MOTRIN) 400 MG tablet Take 400 mg by mouth 2 (two) times daily as needed for mild pain.     Historical Provider, MD  Melatonin 3 MG TABS Take 3 mg by mouth at bedtime.     Historical Provider, MD  NONFORMULARY OR COMPOUNDED ITEM Apply 1 mL topically 2 (two) times daily as needed. Lorazepam 0.5 mg/mL gel--apply 1 mL  (0.5 mg) to wrists as needed for agitation    Historical Provider, MD  oxymetazoline (AFRIN) 0.05 % nasal spray Place 2 sprays into left nostril 3 (three) times daily as needed for congestion. 12/13/15 12/12/16  Charlynne Panderavid Hsienta Yao, MD  sertraline (ZOLOFT) 100 MG tablet Take 100 mg by mouth at bedtime.     Historical Provider, MD    Allergies Patient has no known allergies.    Social History Social History  Substance Use Topics  . Smoking status: Never Smoker  . Smokeless tobacco: Never Used  . Alcohol use No    Review of Systems Patient denies headaches, rhinorrhea, blurry vision, numbness, shortness of breath, chest pain, edema, cough, abdominal pain, nausea, vomiting, diarrhea, dysuria, fevers, rashes or hallucinations unless otherwise stated above in HPI. ____________________________________________   PHYSICAL EXAM:  VITAL SIGNS: Vitals:   02/15/16 1737  BP: 135/61  Pulse: 65  Resp: 18  Temp: 98.5 F (36.9 C)    Constitutional: Chronically ill appearing  Eyes: Conjunctivae are normal. PERRL. EOMI. Head: Ecchymosis and swelling to left parietal scalp with small ,3mm superficial abrasion is currently hemostatic Nose: No congestion/rhinnorhea. Mouth/Throat: Mucous membranes are moist.  Oropharynx non-erythematous. Neck: No stridor.  No cervical spine tenderness to palpation Hematological/Lymphatic/Immunilogical: No cervical lymphadenopathy. Cardiovascular: Normal rate, regular rhythm. Grossly normal heart sounds.  Good peripheral circulation. Respiratory: Normal respiratory  effort.  No retractions. Lungs CTAB. Gastrointestinal: Soft and nontender. No distention. No abdominal bruits. No CVA tenderness. Musculoskeletal: No lower extremity tenderness nor edema.  No joint effusions. No bruising or pain with full passive range of motion of all 4 extremities Neurologic: Unable to assess due to severe dementia Skin:  Skin is warm, dry and intact. No rash  noted.   ____________________________________________   LABS (all labs ordered are listed, but only abnormal results are displayed)  No results found for this or any previous visit (from the past 24 hour(s)). ____________________________________________  EKG My review and personal interpretation at Time: 17:38   Indication: fall  Rate: 60  Rhythm: sinus Axis: normal Other: normal intervals, no acute ischemia ____________________________________________  RADIOLOGY  I personally reviewed all radiographic images ordered to evaluate for the above acute complaints and reviewed radiology reports and findings.  These findings were personally discussed with the patient.  Please see medical record for radiology report.  ____________________________________________   PROCEDURES  Procedure(s) performed:  Procedures    Critical Care performed: no ____________________________________________   INITIAL IMPRESSION / ASSESSMENT AND PLAN / ED COURSE  Pertinent labs & imaging results that were available during my care of the patient were reviewed by me and considered in my medical decision making (see chart for details).  DDX: sah, sdh, edh, fracture, contusion, soft tissue injury, viscous injury, concussion, hemorrhage   Marie PellegriniMary Vega is a 82 y.o. who presents to the ED with unwitnessed fall. Patient with evidence of swelling and bleeding to the left posterior scalp but it is currently hemostatic. Patient otherwise behaving at baseline and appears in no acute distress. We'll order CT head to evaluate for acute intracranial abnormality. No evidence of other associated traumatic injury.  Clinical Course as of Feb 14 1946  Tue Feb 15, 2016  1850 CT head with no acute abnormality. There is no evidence of laceration requiring repair. She does have small superficial abrasion to posterior scalp that is remained hemostatic. Patient otherwise stable for discharge back to facility.  [PR]     Clinical Course User Index [PR] Willy EddyPatrick Karolee Meloni, MD     ____________________________________________   FINAL CLINICAL IMPRESSION(S) / ED DIAGNOSES  Final diagnoses:  Fall, initial encounter      NEW MEDICATIONS STARTED DURING THIS VISIT:  New Prescriptions   No medications on file     Note:  This document was prepared using Dragon voice recognition software and may include unintentional dictation errors.    Willy EddyPatrick Brigett Estell, MD 02/15/16 [id number]

## 2016-02-15 NOTE — ED Notes (Signed)
Discharge instructions reviewed with patient. Questions fielded by this RN. Patient verbalizes understanding of instructions. Patient discharged home in stable condition per Robinson MD . No acute distress noted at time of discharge.   

## 2016-03-05 ENCOUNTER — Emergency Department
Admission: EM | Admit: 2016-03-05 | Discharge: 2016-03-05 | Disposition: A | Discharge status: Home / Self Care | Payer: Medicare Other | Attending: Emergency Medicine | Admitting: Emergency Medicine

## 2016-03-05 DIAGNOSIS — W19XXXA Unspecified fall, initial encounter: Secondary | ICD-10-CM | POA: Diagnosis not present

## 2016-03-05 DIAGNOSIS — Y939 Activity, unspecified: Secondary | ICD-10-CM | POA: Diagnosis not present

## 2016-03-05 DIAGNOSIS — Z043 Encounter for examination and observation following other accident: Secondary | ICD-10-CM | POA: Diagnosis not present

## 2016-03-05 DIAGNOSIS — Z7982 Long term (current) use of aspirin: Secondary | ICD-10-CM | POA: Insufficient documentation

## 2016-03-05 DIAGNOSIS — Z139 Encounter for screening, unspecified: Secondary | ICD-10-CM

## 2016-03-05 DIAGNOSIS — Z79899 Other long term (current) drug therapy: Secondary | ICD-10-CM | POA: Diagnosis not present

## 2016-03-05 DIAGNOSIS — Y999 Unspecified external cause status: Secondary | ICD-10-CM | POA: Diagnosis not present

## 2016-03-05 DIAGNOSIS — Z791 Long term (current) use of non-steroidal anti-inflammatories (NSAID): Secondary | ICD-10-CM | POA: Insufficient documentation

## 2016-03-05 DIAGNOSIS — Y92129 Unspecified place in nursing home as the place of occurrence of the external cause: Secondary | ICD-10-CM | POA: Insufficient documentation

## 2016-03-05 LAB — GLUCOSE, CAPILLARY: Glucose-Capillary: 104 mg/dL — ABNORMAL HIGH (ref 65–99)

## 2016-03-05 NOTE — ED Notes (Signed)
Dr. Williams signed off on EKG 

## 2016-03-05 NOTE — ED Triage Notes (Signed)
PT came to ED via EMS from [facility] after unwitnessed fall. Pt denies pain. Pt history of alzhemiers/dementia, at her baseline. No blood thinners.

## 2016-03-05 NOTE — ED Notes (Signed)
Pt's son called at pt request, states unable to transport pt

## 2016-03-05 NOTE — ED Provider Notes (Signed)
[facility] Emergency Department Provider Note  ____________________________________________  Time seen: Approximately 7:35 PM  I have reviewed the triage vital signs and the nursing notes.   HISTORY  Chief Complaint Fall  Level 5 caveat:  Portions of the history and physical were unable to be obtained due to the patient's altered mental status due to advanced chronic dementia  HPI Marie Vega is a 83 y.o. female sent to the ED from [facility] due to an unwitnessed fall. Patient denies any complaints or pain. She cannot remember falling today. No concerns per EMS, nursing home disorder patient evaluated due to protocol from unwitnessed fall. Not on blood thinners. Patient is reported to be at her neurologic baseline currently.     Past Medical History:  Diagnosis Date  . Dementia   . Depression   . Syphilis      Patient Active Problem List   Diagnosis Date Noted  . Acute encephalopathy 10/13/2015     History reviewed. No pertinent surgical history.   Prior to Admission medications   Medication Sig Start Date End Date Taking? Authorizing Provider  acetaminophen (TYLENOL) 500 MG tablet Take 500 mg by mouth at bedtime as needed for mild pain.    Yes Historical Provider, MD  aspirin 81 MG chewable tablet Chew 81 mg by mouth daily.   Yes Historical Provider, MD  benzocaine (ORAJEL) 10 % mucosal gel Use as directed 1 application in the mouth or throat 3 (three) times daily as needed for mouth pain.   Yes Historical Provider, MD  busPIRone (BUSPAR) 5 MG tablet Take 5 mg by mouth 2 (two) times daily.   Yes Historical Provider, MD  feeding supplement, ENSURE ENLIVE, (ENSURE ENLIVE) LIQD Take 237 mLs by mouth 3 (three) times daily between meals. 10/14/15  Yes Altamese DillingVaibhavkumar Vachhani, MD  ibuprofen (ADVIL,MOTRIN) 400 MG tablet Take 400 mg by mouth 2 (two) times daily as needed for mild pain.    Yes Historical Provider, MD  lisinopril (PRINIVIL,ZESTRIL) 10 MG  tablet Take 10 mg by mouth daily.   Yes Historical Provider, MD  Melatonin 3 MG TABS Take 3 mg by mouth at bedtime.    Yes Historical Provider, MD  NONFORMULARY OR COMPOUNDED ITEM Apply 1 mL topically 2 (two) times daily as needed. Lorazepam 0.5 mg/mL gel--apply 1 mL (0.5 mg) to wrists as needed for agitation   Yes Historical Provider, MD  oxymetazoline (AFRIN) 0.05 % nasal spray Place 2 sprays into left nostril 3 (three) times daily as needed for congestion. 12/13/15 12/12/16 Yes Charlynne Panderavid Hsienta Yao, MD  sertraline (ZOLOFT) 100 MG tablet Take 100 mg by mouth at bedtime.    Yes Historical Provider, MD     Allergies Patient has no known allergies.   Family History  Problem Relation Age of Onset  . Hypertension Other     Social History Social History  Substance Use Topics  . Smoking status: Never Smoker  . Smokeless tobacco: Never Used  . Alcohol use No    Review of Systems Unable to reliably obtained due to altered mental status ____________________________________________   PHYSICAL EXAM:  VITAL SIGNS: ED Triage Vitals [03/05/16 1901]  Enc Vitals Group     BP (!) 150/0     Pulse Rate 60     Resp 18     Temp 98.5 F (36.9 C)     Temp Source Oral     SpO2 99 %     Weight 107 lb (48.5 kg)  Height 5\' 6"  (1.676 m)     Head Circumference      Peak Flow      Pain Score      Pain Loc      Pain Edu?      Excl. in GC?     Vital signs reviewed, nursing assessments reviewed.   Constitutional:   Alert and orientedTo self. Well appearing and in no distress. Eyes:   No scleral icterus. No conjunctival pallor. PERRL. EOMI.  No nystagmus. ENT   Head:   Normocephalic and atraumatic. No contusion abrasion or swelling. No bony tenderness.   Nose:   No congestion/rhinnorhea. No septal hematoma   Mouth/Throat:   MMM, no pharyngeal erythema. No peritonsillar mass.    Neck:   No stridor. No SubQ emphysema. No meningismus. No midline tenderness. Full range of  motion. Hematological/Lymphatic/Immunilogical:   No cervical lymphadenopathy. Cardiovascular:   RRR. Symmetric bilateral radial and DP pulses.  No murmurs.  Respiratory:   Normal respiratory effort without tachypnea nor retractions. Breath sounds are clear and equal bilaterally. No wheezes/rales/rhonchi. Gastrointestinal:   Soft and nontender. Non distended. There is no CVA tenderness.  No rebound, rigidity, or guarding. Genitourinary:   deferred Musculoskeletal:   Nontender with normal range of motion in all extremities. No joint effusions.  No lower extremity tenderness.  No edema. No deformities. No soft tissue injury. Neurologic:   Normal speech and language.  CN 2-10 normal. Motor grossly intact. No gross focal neurologic deficits are appreciated.  Skin:    Skin is warm, dry and intact. No rash noted.  No petechiae, purpura, or bullae. No abrasions or skin tears.  ____________________________________________    LABS (pertinent positives/negatives) (all labs ordered are listed, but only abnormal results are displayed) Labs Reviewed - No data to display ____________________________________________   EKG  EKG pending  ____________________________________________    RADIOLOGY    ____________________________________________   PROCEDURES Procedures  ____________________________________________   INITIAL IMPRESSION / ASSESSMENT AND PLAN / ED COURSE  Pertinent labs & imaging results that were available during my care of the patient were reviewed by me and considered in my medical decision making (see chart for details).  Patient is well-appearing no acute distress. Sent to the ED for unwitnessed fall. No complaints. Physical exam is unremarkable without any evidence of trauma whatsoever. I highly doubt any significant injury from the fall today. She appears to be at baseline. We will check EKG and blood glucose and plan to discharge home for primary care  follow-up.     Clinical Course    ____________________________________________   FINAL CLINICAL IMPRESSION(S) / ED DIAGNOSES  Final diagnoses:  Fall, initial encounter  Encounter for medical screening examination      New Prescriptions   No medications on file     Portions of this note were generated with dragon dictation software. Dictation errors may occur despite best attempts at proofreading.    Sharman Cheek, MD 03/05/16 2023

## 2016-03-06 ENCOUNTER — Emergency Department
Admission: EM | Admit: 2016-03-06 | Discharge: 2016-03-06 | Disposition: A | Discharge status: Home / Self Care | Payer: Medicare Other | Attending: Emergency Medicine | Admitting: Emergency Medicine

## 2016-03-06 ENCOUNTER — Encounter: Payer: Self-pay | Admitting: Emergency Medicine

## 2016-03-06 ENCOUNTER — Emergency Department: Payer: Medicare Other

## 2016-03-06 DIAGNOSIS — W06XXXA Fall from bed, initial encounter: Secondary | ICD-10-CM | POA: Diagnosis not present

## 2016-03-06 DIAGNOSIS — S299XXA Unspecified injury of thorax, initial encounter: Secondary | ICD-10-CM | POA: Diagnosis present

## 2016-03-06 DIAGNOSIS — F0390 Unspecified dementia without behavioral disturbance: Secondary | ICD-10-CM | POA: Insufficient documentation

## 2016-03-06 DIAGNOSIS — Y999 Unspecified external cause status: Secondary | ICD-10-CM | POA: Diagnosis not present

## 2016-03-06 DIAGNOSIS — S20419A Abrasion of unspecified back wall of thorax, initial encounter: Secondary | ICD-10-CM | POA: Diagnosis not present

## 2016-03-06 DIAGNOSIS — W19XXXA Unspecified fall, initial encounter: Secondary | ICD-10-CM

## 2016-03-06 DIAGNOSIS — Y92129 Unspecified place in nursing home as the place of occurrence of the external cause: Secondary | ICD-10-CM | POA: Diagnosis not present

## 2016-03-06 DIAGNOSIS — Z23 Encounter for immunization: Secondary | ICD-10-CM | POA: Insufficient documentation

## 2016-03-06 DIAGNOSIS — Y939 Activity, unspecified: Secondary | ICD-10-CM | POA: Diagnosis not present

## 2016-03-06 DIAGNOSIS — Z7982 Long term (current) use of aspirin: Secondary | ICD-10-CM | POA: Insufficient documentation

## 2016-03-06 MED ORDER — TETANUS-DIPHTH-ACELL PERTUSSIS 5-2.5-18.5 LF-MCG/0.5 IM SUSP
0.5000 mL | Freq: Once | INTRAMUSCULAR | Status: AC
  Administered 2016-03-06: 0.5 mL via INTRAMUSCULAR
  Filled 2016-03-06: qty 0.5

## 2016-03-06 NOTE — ED Triage Notes (Signed)
Patient brought in by ems from [facility] for a fall. Patient fell out of bed. Patient with abrasion to back.

## 2016-03-06 NOTE — ED Provider Notes (Signed)
[facility] Emergency Department Provider Note   ____________________________________________   First MD Initiated Contact with Patient 03/06/16 [phone]     (approximate)  I have reviewed the triage vital signs and the nursing notes.   HISTORY  Chief Complaint Fall  CHIEF COMPLAINT IS UNWITNESSED FALL History limited by dementia HPI Marie Vega is a 83 y.o. female WHO WAS SEEN HERE EARLIER FOR AN UNWITNESSED FALL EVALUATED AND SENT BACK TO [place] HOW SHE WAS IN [facility] APPROXIMATELY 45 MINUTES WHEN SHE WAS FOUND ON THE FLOOR AGAIN. sHE CAME IS BACK HERE FOR REEVALUATION FOR ANOTHER UNWITNESSED FALL. pATIENT DOES REMEMBER THE FALL AND SAYS NOTHING HURTS HOWEVER SHE HAS A SCRATCH ON HER BACK   Past Medical History:  Diagnosis Date  . Dementia   . Depression   . Syphilis     Patient Active Problem List   Diagnosis Date Noted  . Acute encephalopathy 10/13/2015    History reviewed. No pertinent surgical history.  Prior to Admission medications   Medication Sig Start Date End Date Taking? Authorizing Provider  acetaminophen (TYLENOL) 500 MG tablet Take 500 mg by mouth at bedtime as needed for mild pain.     Historical Provider, MD  aspirin 81 MG chewable tablet Chew 81 mg by mouth daily.    Historical Provider, MD  benzocaine (ORAJEL) 10 % mucosal gel Use as directed 1 application in the mouth or throat 3 (three) times daily as needed for mouth pain.    Historical Provider, MD  busPIRone (BUSPAR) 5 MG tablet Take 5 mg by mouth 2 (two) times daily.    Historical Provider, MD  feeding supplement, ENSURE ENLIVE, (ENSURE ENLIVE) LIQD Take 237 mLs by mouth 3 (three) times daily between meals. 10/14/15   Altamese Dilling, MD  ibuprofen (ADVIL,MOTRIN) 400 MG tablet Take 400 mg by mouth 2 (two) times daily as needed for mild pain.     Historical Provider, MD  lisinopril (PRINIVIL,ZESTRIL) 10 MG tablet Take 10 mg by mouth daily.    Historical Provider,  MD  Melatonin 3 MG TABS Take 3 mg by mouth at bedtime.     Historical Provider, MD  NONFORMULARY OR COMPOUNDED ITEM Apply 1 mL topically 2 (two) times daily as needed. Lorazepam 0.5 mg/mL gel--apply 1 mL (0.5 mg) to wrists as needed for agitation    Historical Provider, MD  oxymetazoline (AFRIN) 0.05 % nasal spray Place 2 sprays into left nostril 3 (three) times daily as needed for congestion. 12/13/15 12/12/16  Charlynne Pander, MD  sertraline (ZOLOFT) 100 MG tablet Take 100 mg by mouth at bedtime.     Historical Provider, MD    Allergies Patient has no known allergies.  Family History  Problem Relation Age of Onset  . Hypertension Other     Social History Social History  Substance Use Topics  . Smoking status: Never Smoker  . Smokeless tobacco: Never Used  . Alcohol use No    Review of Systems  Unable to obtain review of systems except for the patient says nothing hurts ____________________________________________   PHYSICAL EXAM:  VITAL SIGNS: ED Triage Vitals  Enc Vitals Group     BP      Pulse      Resp      Temp      Temp src      SpO2      Weight      Height      Head Circumference  Peak Flow      Pain Score      Pain Loc      Pain Edu?      Excl. in GC?     Constitutional: Alert and orientedo self and hospital. Well appearing and in no acute distress. Eyes: Conjunctivae are normal. PERRL. EOMI. Head: Atraumatic. Nose: No congestion/rhinnorhea. Mouth/Throat: Mucous membranes are moist.  Oropharynx non-erythematous. Neck: No stridor.  No cervical spine tenderness to palpation. Cardiovascular: Normal rate, regular rhythm. Grossly normal heart sounds.  Good peripheral circulation. Respiratory: Normal respiratory effort.  No retractions. Lungs CTAB. Gastrointestinal: Soft and nontender. No distention. No abdominal bruits. No CVA tenderness. Musculoskeletal: No lower extremity tenderness nor edema.  No joint effusions.patient does have an abrasion on  her backshe is tender on the spine underneath the abrasion Patient is able to stand without any pain. She can stand on either leg without any pain. She has no pain on palpation of the legs abdomen pelvis chest or arms neck or head Skin:  Skin is warm, dry and intact. No rash noted. Psychiatric: Mood and affect are normal. Speech and behavior are normal.  ____________________________________________   LABS (all labs ordered are listed, but only abnormal results are displayed)  Labs Reviewed - No data to display ____________________________________________  EKG   ____________________________________________  RADIOLOGY Study Result   CLINICAL DATA:  Fall from bed  EXAM: LUMBAR SPINE - COMPLETE 4+ VIEW; THORACIC SPINE 2 VIEWS  COMPARISON:  None.  FINDINGS: Thoracic spine: There is flowing osteophytosis along the length of the thoracic spine. There is exaggerated kyphosis. No acute fracture identified. No static subluxation. There is atherosclerosis of the aorta.  Lumbar spine: There is no acute fracture or static subluxation. There is severe disc height loss at L2-L3. Small anterior osteophytes are present at all levels. There is moderate facet hypertrophy.  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   Electronically Signed   By: Deatra RobinsonKevin  Herman M.D.   On: 03/06/2016 04:48      ____________________________________________   PROCEDURES  Procedure(s) performed:   Procedures  Critical Care performed:   ____________________________________________   INITIAL IMPRESSION / ASSESSMENT AND PLAN / ED COURSE  Pertinent labs & imaging results that were available during my care of the patient were reviewed by me and considered in my medical decision making (see chart for details).    Clinical Course      ____________________________________________   FINAL CLINICAL IMPRESSION(S) / ED  DIAGNOSES  Final diagnoses:  Fall, initial encounter      NEW MEDICATIONS STARTED DURING THIS VISIT:  New Prescriptions   No medications on file     Note:  This document was prepared using Dragon voice recognition software and may include unintentional dictation errors.    Arnaldo NatalPaul F Malinda, MD 03/06/16 [phone]

## 2016-03-06 NOTE — ED Notes (Signed)
Eyes closed, resp even and unlabored; pt waiting for transport back to [facility]

## 2016-03-06 NOTE — Discharge Instructions (Signed)
Please be careful and do not let the patient fall again.

## 2016-03-06 NOTE — ED Notes (Signed)
Pt discharged via ambulance to [facility].

## 2016-03-06 NOTE — ED Notes (Signed)
Pt assisted back to bed; side rails up x 2 with call bell in reach; warm blanket for comfort

## 2016-03-06 NOTE — ED Notes (Signed)
Pt resting quietly in bed; continues to deny pain;

## 2016-03-06 NOTE — ED Notes (Signed)
Pt assisted to toilet in room to void with slow gait; denies back pain; abrasion noted to mid back;

## 2016-03-11 ENCOUNTER — Encounter: Payer: Self-pay | Admitting: Emergency Medicine

## 2016-03-11 ENCOUNTER — Emergency Department
Admission: EM | Admit: 2016-03-11 | Discharge: 2016-03-11 | Disposition: A | Discharge status: Home / Self Care | Payer: Medicare Other | Attending: Emergency Medicine | Admitting: Emergency Medicine

## 2016-03-11 ENCOUNTER — Emergency Department: Payer: Medicare Other

## 2016-03-11 DIAGNOSIS — W19XXXA Unspecified fall, initial encounter: Secondary | ICD-10-CM | POA: Insufficient documentation

## 2016-03-11 DIAGNOSIS — Y939 Activity, unspecified: Secondary | ICD-10-CM | POA: Insufficient documentation

## 2016-03-11 DIAGNOSIS — R296 Repeated falls: Secondary | ICD-10-CM | POA: Diagnosis not present

## 2016-03-11 DIAGNOSIS — Z043 Encounter for examination and observation following other accident: Secondary | ICD-10-CM | POA: Diagnosis not present

## 2016-03-11 DIAGNOSIS — Z7982 Long term (current) use of aspirin: Secondary | ICD-10-CM | POA: Diagnosis not present

## 2016-03-11 DIAGNOSIS — Z79899 Other long term (current) drug therapy: Secondary | ICD-10-CM | POA: Diagnosis not present

## 2016-03-11 DIAGNOSIS — Z791 Long term (current) use of non-steroidal anti-inflammatories (NSAID): Secondary | ICD-10-CM | POA: Insufficient documentation

## 2016-03-11 DIAGNOSIS — Y999 Unspecified external cause status: Secondary | ICD-10-CM | POA: Diagnosis not present

## 2016-03-11 DIAGNOSIS — F0390 Unspecified dementia without behavioral disturbance: Secondary | ICD-10-CM | POA: Insufficient documentation

## 2016-03-11 DIAGNOSIS — Y92129 Unspecified place in nursing home as the place of occurrence of the external cause: Secondary | ICD-10-CM | POA: Insufficient documentation

## 2016-03-11 LAB — BASIC METABOLIC PANEL
Anion gap: 6 (ref 5–15)
BUN: 12 mg/dL (ref 6–20)
CHLORIDE: 103 mmol/L (ref 101–111)
CO2: 32 mmol/L (ref 22–32)
CREATININE: 0.73 mg/dL (ref 0.44–1.00)
Calcium: 9 mg/dL (ref 8.9–10.3)
Glucose, Bld: 105 mg/dL — ABNORMAL HIGH (ref 65–99)
Potassium: 4.1 mmol/L (ref 3.5–5.1)
SODIUM: 141 mmol/L (ref 135–145)

## 2016-03-11 LAB — CBC
HEMATOCRIT: 40.8 % (ref 35.0–47.0)
Hemoglobin: 13.5 g/dL (ref 12.0–16.0)
MCH: 28 pg (ref 26.0–34.0)
MCHC: 33.2 g/dL (ref 32.0–36.0)
MCV: 84.4 fL (ref 80.0–100.0)
PLATELETS: 222 10*3/uL (ref 150–440)
RBC: 4.84 MIL/uL (ref 3.80–5.20)
RDW: 15 % — ABNORMAL HIGH (ref 11.5–14.5)
WBC: 6.7 10*3/uL (ref 3.6–11.0)

## 2016-03-11 NOTE — ED Notes (Signed)
Patient transported to CT 

## 2016-03-11 NOTE — Discharge Instructions (Signed)
You have been seen in the emergency department after a fall. Your workup including blood work and CT scan of your head are normal. Please follow-up with your primary care doctor for further evaluation. Return to the emergency department for any further falls, or any other symptom personally concerning to yourself or staff.

## 2016-03-11 NOTE — ED Notes (Signed)
EMS is here for transport. 

## 2016-03-11 NOTE — ED Notes (Signed)
Pt slightly agitated. Remains connected to fall alarm. NAD.

## 2016-03-11 NOTE — ED Triage Notes (Signed)
Pt from [facility]. Found down on floor with head under wheelchair. Unknown down or how she fell. Is fall risk per report and a wonderer r/t dementia. Pt at baseline mental status per report.

## 2016-03-11 NOTE — ED Provider Notes (Signed)
[facility] Emergency Department Provider Note  Time seen: 10:11 AM  I have reviewed the triage vital signs and the nursing notes.   HISTORY  Chief Complaint Fall    HPI Marie Vega is a 83 y.o. female with a past medical history of dementia, who presents the emergency department after a fall. According to EMS report the patient has a history of dementia and frequent falls, was found down in her room, unwitnessed fall. Here the patient is agitated at times, however I examined her while calm and she has no complaints and no apparent discomfort. No signs of trauma on exam. Patient cannot contribute to her history.  Past Medical History:  Diagnosis Date  . Dementia   . Depression   . Syphilis     Patient Active Problem List   Diagnosis Date Noted  . Acute encephalopathy 10/13/2015    History reviewed. No pertinent surgical history.  Prior to Admission medications   Medication Sig Start Date End Date Taking? Authorizing Provider  acetaminophen (TYLENOL) 500 MG tablet Take 500 mg by mouth at bedtime as needed for mild pain.    Yes Historical Provider, MD  aspirin 81 MG chewable tablet Chew 81 mg by mouth daily.   Yes Historical Provider, MD  benzocaine (ORAJEL) 10 % mucosal gel Use as directed 1 application in the mouth or throat 3 (three) times daily as needed for mouth pain.   Yes Historical Provider, MD  busPIRone (BUSPAR) 5 MG tablet Take 5 mg by mouth 2 (two) times daily.   Yes Historical Provider, MD  feeding supplement, ENSURE ENLIVE, (ENSURE ENLIVE) LIQD Take 237 mLs by mouth 3 (three) times daily between meals. 10/14/15  Yes Altamese Dilling, MD  ibuprofen (ADVIL,MOTRIN) 400 MG tablet Take 400 mg by mouth 2 (two) times daily as needed for mild pain.    Yes Historical Provider, MD  lisinopril (PRINIVIL,ZESTRIL) 10 MG tablet Take 10 mg by mouth daily.   Yes Historical Provider, MD  Melatonin 3 MG TABS Take 3 mg by mouth at bedtime.    Yes Historical  Provider, MD  NONFORMULARY OR COMPOUNDED ITEM Apply 1 mL topically 2 (two) times daily as needed. Lorazepam 0.5 mg/mL gel--apply 1 mL (0.5 mg) to wrists as needed for agitation   Yes Historical Provider, MD  oseltamivir (TAMIFLU) 75 MG capsule Take 75 mg by mouth 2 (two) times daily. 03/08/16 03/12/16 Yes Historical Provider, MD  oxymetazoline (AFRIN) 0.05 % nasal spray Place 2 sprays into left nostril 3 (three) times daily as needed for congestion. 12/13/15 12/12/16 Yes Charlynne Pander, MD  sertraline (ZOLOFT) 100 MG tablet Take 100 mg by mouth at bedtime.    Yes Historical Provider, MD    No Known Allergies  Family History  Problem Relation Age of Onset  . Hypertension Other     Social History Social History  Substance Use Topics  . Smoking status: Never Smoker  . Smokeless tobacco: Never Used  . Alcohol use No    Review of Systems Unable to obtain a review of systems due to significant dementia.  ____________________________________________   PHYSICAL EXAM:  VITAL SIGNS: ED Triage Vitals  Enc Vitals Group     BP --      Pulse --      Resp 03/11/16 0847 16     Temp 03/11/16 0847 97.8 F (36.6 C)     Temp Source 03/11/16 0847 Oral     SpO2 03/11/16 0847 98 %  Weight 03/11/16 0843 110 lb (49.9 kg)     Height 03/11/16 0843 5\' 6"  (1.676 m)     Head Circumference --      Peak Flow --      Pain Score --      Pain Loc --      Pain Edu? --      Excl. in GC? --    Constitutional: Alert. Calm, cooperative. No distress at this time. Eyes: Normal exam ENT   Head: Normocephalic and atraumatic.   Mouth/Throat: Mucous membranes are moist. Cardiovascular: Normal rate, regular rhythm. No murmur Respiratory: Normal respiratory effort without tachypnea nor retractions. Breath sounds are clear Gastrointestinal: Soft and nontender. No distention.  Musculoskeletal: Nontender with normal range of motion in all extremities. Good range of motion in all extremities. No C, T  or L-spine tenderness. Neurologic:  Normal speech and language. No gross focal neurologic deficits Skin:  Skin is warm, dry and intact.  Psychiatric: Mood and affect are normal.   ____________________________________________    EKG  EKG reviewed and interpreted by myself shows normal sinus rhythm at 59 bpm. Narrow QRS, normal axis, normal intervals, no concerning ST changes.  ____________________________________________    RADIOLOGY  CT head negative  ____________________________________________   INITIAL IMPRESSION / ASSESSMENT AND PLAN / ED COURSE  Pertinent labs & imaging results that were available during my care of the patient were reviewed by me and considered in my medical decision making (see chart for details).  The patient presents to the emergency department after a fall. Significant dementia, cannot contribute to her history. We will check basic labs, CT head. Unable to obtain urinalysis after in and out attempts. Patient very agitated, given her history of falls or do not believe urinalysis is required and may do more harm than benefit in obtaining the urinalysis. Basic labs are largely within normal limits. CT head is negative patient will be discharged home with routine follow-up.  CT scan of the head is negative. Labs are largely within normal limits. Patient continues to appear well. As long as the patient is able to ambulate we will discharge the patient home.  ____________________________________________   FINAL CLINICAL IMPRESSION(S) / ED DIAGNOSES  Fall Dementia    Minna AntisKevin Nikitas Davtyan, MD 03/11/16 1102

## 2016-03-11 NOTE — ED Notes (Signed)
Fall alarm attached to pt

## 2016-03-11 NOTE — ED Notes (Addendum)
In and out cath attempted with 2 RN at bedside. No urine returned. Dr Lenard Lancepaduchowski aware

## 2017-01-26 ENCOUNTER — Emergency Department
Admission: EM | Admit: 2017-01-26 | Discharge: 2017-01-26 | Disposition: A | Discharge status: Home / Self Care | Attending: Emergency Medicine | Admitting: Emergency Medicine

## 2017-01-26 ENCOUNTER — Emergency Department

## 2017-01-26 DIAGNOSIS — S0003XA Contusion of scalp, initial encounter: Secondary | ICD-10-CM | POA: Diagnosis not present

## 2017-01-26 DIAGNOSIS — F0390 Unspecified dementia without behavioral disturbance: Secondary | ICD-10-CM | POA: Insufficient documentation

## 2017-01-26 DIAGNOSIS — Y999 Unspecified external cause status: Secondary | ICD-10-CM | POA: Diagnosis not present

## 2017-01-26 DIAGNOSIS — Y939 Activity, unspecified: Secondary | ICD-10-CM | POA: Insufficient documentation

## 2017-01-26 DIAGNOSIS — Y92129 Unspecified place in nursing home as the place of occurrence of the external cause: Secondary | ICD-10-CM | POA: Diagnosis not present

## 2017-01-26 DIAGNOSIS — Z79899 Other long term (current) drug therapy: Secondary | ICD-10-CM | POA: Insufficient documentation

## 2017-01-26 DIAGNOSIS — W19XXXA Unspecified fall, initial encounter: Secondary | ICD-10-CM | POA: Diagnosis not present

## 2017-01-26 DIAGNOSIS — Z7982 Long term (current) use of aspirin: Secondary | ICD-10-CM | POA: Insufficient documentation

## 2017-01-26 DIAGNOSIS — S0990XA Unspecified injury of head, initial encounter: Secondary | ICD-10-CM | POA: Diagnosis present

## 2017-01-26 NOTE — ED Provider Notes (Signed)
[facility] Emergency Department Provider Note  ____________________________________________  Time seen: Approximately 6:51 PM  I have reviewed the triage vital signs and the nursing notes.   HISTORY  Chief Complaint Fall  Level 5 Caveat: Portions of the History and Physical are unable to be obtained due to patient being a poor historian due to advanced dementia   HPI Marie PellegriniMary Vega is a 83 y.o. female sent to the ED for evaluation of an unwitnessed fall. She has swelling of the right forehead. Patient denies any complaints. Patient has advanced dementia and is unable to relate any acute symptoms. She states she feels fine. [name] files reports that the patient is at baseline mental status. No other reported injuries or concerns.     Past Medical History:  Diagnosis Date  . Dementia   . Depression   . Syphilis      Patient Active Problem List   Diagnosis Date Noted  . Acute encephalopathy 10/13/2015     History reviewed. No pertinent surgical history.   Prior to Admission medications   Medication Sig Start Date End Date Taking? Authorizing Provider  aspirin 81 MG chewable tablet Chew 81 mg by mouth daily.   Yes [provider]  busPIRone (BUSPAR) 5 MG tablet Take 5 mg by mouth 2 (two) times daily.   Yes [provider]  fluticasone (FLONASE) 50 MCG/ACT nasal spray Place 1 spray into both nostrils daily.   Yes [provider]  Melatonin 3 MG TABS Take 3 mg by mouth at bedtime.    Yes [provider]  sertraline (ZOLOFT) 100 MG tablet Take 100 mg by mouth at bedtime.    Yes [provider]  acetaminophen (TYLENOL) 500 MG tablet Take 500 mg by mouth at bedtime as needed for mild pain.     [provider]  feeding supplement, ENSURE ENLIVE, (ENSURE ENLIVE) LIQD Take 237 mLs by mouth 3 (three) times daily between meals. 10/14/15   [name], [name], MD  ibuprofen (ADVIL,MOTRIN) 400 MG tablet  Take 400 mg by mouth 2 (two) times daily as needed for mild pain.     [provider]     Allergies Patient has no known allergies.   Family History  Problem Relation Age of Onset  . Hypertension Other     Social History Social History   Tobacco Use  . Smoking status: Never Smoker  . Smokeless tobacco: Never Used  Substance Use Topics  . Alcohol use: No  . Drug use: No    Review of Systems Unable to reliably obtained due to advanced dementia  ____________________________________________   PHYSICAL EXAM:  VITAL SIGNS: ED Triage Vitals  Enc Vitals Group     BP 01/26/17 1711 128/73     Pulse Rate 01/26/17 1711 77     Resp 01/26/17 1711 19     Temp 01/26/17 1711 98.1 F (36.7 C)     Temp Source 01/26/17 1711 Oral     SpO2 01/26/17 1711 96 %     Weight 01/26/17 1712 110 lb (49.9 kg)     Height --      Head Circumference --      Peak Flow --      Pain Score --      Pain Loc --      Pain Edu? --      Excl. in GC? --     Vital signs reviewed, nursing assessments reviewed.   Constitutional:  Awake and alert, oriented to  self. Well appearing and in no distress. Eyes:   No scleral icterus.  EOMI. No nystagmus. No conjunctival pallor. PERRL. ENT   Head:   Normocephalic with a 3-4 cm hematoma on the right forehead. No focal tenderness. No laceration. No skull depression. No hemotympanum   Nose:   No congestion/rhinnorhea. No epistaxis    Mouth/Throat:   MMM, no pharyngeal erythema. No peritonsillar mass. No intraoral injury   Neck:   No meningismus. Full ROM. No midline spinal tenderness Hematological/Lymphatic/Immunilogical:   No cervical lymphadenopathy. Cardiovascular:   RRR. Symmetric bilateral radial and DP pulses.  No murmurs.  Respiratory:   Normal respiratory effort without tachypnea/retractions. Breath sounds are clear and equal bilaterally. No wheezes/rales/rhonchi. Gastrointestinal:   Soft and nontender. Non distended. There is no  CVA tenderness.  No rebound, rigidity, or guarding. Genitourinary:   deferred Musculoskeletal:   Normal range of motion in all extremities. No joint effusions.  No lower extremity tenderness.  No edema. No hip tenderness. Full passive range of motion. No focal bony tenderness. Neurologic:   Normal speech , repetitive nonlinear language Motor grossly intact. No acute focal neurologic deficits are appreciated.  Skin:    Skin is warm, dry and intact. No rash noted.  No petechiae, purpura, or bullae.  ____________________________________________    LABS (pertinent positives/negatives) (all labs ordered are listed, but only abnormal results are displayed) Labs Reviewed - No data to display ____________________________________________   EKG    ____________________________________________    RADIOLOGY  Ct Head Wo Contrast  Result Date: 01/26/2017 CLINICAL DATA:  Pain following fall EXAM: CT HEAD WITHOUT CONTRAST CT CERVICAL SPINE WITHOUT CONTRAST TECHNIQUE: Multidetector CT imaging of the head and cervical spine was performed following the standard protocol without intravenous contrast. Multiplanar CT image reconstructions of the cervical spine were also generated. COMPARISON:  Head CT March 11, 2016; cervical spine CT January 07, 2016 FINDINGS: CT HEAD FINDINGS Brain: There is moderate diffuse atrophy. There is no intracranial mass, hemorrhage, extra-axial fluid collection, or midline shift. There is patchy small vessel disease in the centra semiovale bilaterally. Elsewhere gray-white compartments appear normal. No acute infarct evident. Vascular: No hyperdense vessel. There is calcification in each carotid siphon as well as subtly in the distal left vertebral artery. Skull: The bony calvarium appears intact. There is a right frontal scalp hematoma. Sinuses/Orbits: There is mucosal thickening in multiple ethmoid air cells. There is opacification of posterior right ethmoid air cell. There is  mucosal thickening in the left sphenoid sinus region. Other visualized paranasal sinuses elsewhere are clear. Orbits appear symmetric bilaterally. There are old healed nasal fractures bilaterally, stable. Other: Mastoid air cells are clear. There is mild debris in the left external auditory canal. CT CERVICAL SPINE FINDINGS Alignment: There is 2 mm of anterolisthesis of C7 on T1. No other spondylolisthesis evident. Skull base and vertebrae: Skull base and craniocervical junction regions appear normal. There is moderate pannus posterior to the odontoid, not causing appreciable impression on the craniocervical junction. There is no appreciable fracture. There are no blastic or lytic bone lesions. Soft tissues and spinal canal: Prevertebral soft tissues and predental space regions are normal. No paraspinous lesions. No cord or canal hematoma evident. Disc levels: There is a moderately severe disc space narrowing at C5-6. There is facet hypertrophy at several levels bilaterally. There is no frank disc extrusion or stenosis. Upper chest: There is scarring in the right apex region. No upper lung zone edema or consolidation. Other: There is calcification in each carotid  artery. There is also calcification in the distal left vertebral artery. IMPRESSION: CT head: Atrophy with patchy periventricular small vessel disease. No intracranial mass, hemorrhage, or acute infarct. Foci of arterial vascular calcification noted. Areas of paranasal sinus disease noted. Probable cerumen left external auditory canal. Old healed fractures of the nasal bone noted. CT cervical spine: No fracture. Slight spondylolisthesis at C7-T1. Osteoarthritic change noted at several levels. Calcification in each carotid artery and distal left vertebral artery. Electronically Signed   By: Bretta Bang III M.D.   On: 01/26/2017 17:45   Ct Cervical Spine Wo Contrast  Result Date: 01/26/2017 CLINICAL DATA:  Pain following fall EXAM: CT HEAD WITHOUT  CONTRAST CT CERVICAL SPINE WITHOUT CONTRAST TECHNIQUE: Multidetector CT imaging of the head and cervical spine was performed following the standard protocol without intravenous contrast. Multiplanar CT image reconstructions of the cervical spine were also generated. COMPARISON:  Head CT March 11, 2016; cervical spine CT January 07, 2016 FINDINGS: CT HEAD FINDINGS Brain: There is moderate diffuse atrophy. There is no intracranial mass, hemorrhage, extra-axial fluid collection, or midline shift. There is patchy small vessel disease in the centra semiovale bilaterally. Elsewhere gray-white compartments appear normal. No acute infarct evident. Vascular: No hyperdense vessel. There is calcification in each carotid siphon as well as subtly in the distal left vertebral artery. Skull: The bony calvarium appears intact. There is a right frontal scalp hematoma. Sinuses/Orbits: There is mucosal thickening in multiple ethmoid air cells. There is opacification of posterior right ethmoid air cell. There is mucosal thickening in the left sphenoid sinus region. Other visualized paranasal sinuses elsewhere are clear. Orbits appear symmetric bilaterally. There are old healed nasal fractures bilaterally, stable. Other: Mastoid air cells are clear. There is mild debris in the left external auditory canal. CT CERVICAL SPINE FINDINGS Alignment: There is 2 mm of anterolisthesis of C7 on T1. No other spondylolisthesis evident. Skull base and vertebrae: Skull base and craniocervical junction regions appear normal. There is moderate pannus posterior to the odontoid, not causing appreciable impression on the craniocervical junction. There is no appreciable fracture. There are no blastic or lytic bone lesions. Soft tissues and spinal canal: Prevertebral soft tissues and predental space regions are normal. No paraspinous lesions. No cord or canal hematoma evident. Disc levels: There is a moderately severe disc space narrowing at C5-6. There  is facet hypertrophy at several levels bilaterally. There is no frank disc extrusion or stenosis. Upper chest: There is scarring in the right apex region. No upper lung zone edema or consolidation. Other: There is calcification in each carotid artery. There is also calcification in the distal left vertebral artery. IMPRESSION: CT head: Atrophy with patchy periventricular small vessel disease. No intracranial mass, hemorrhage, or acute infarct. Foci of arterial vascular calcification noted. Areas of paranasal sinus disease noted. Probable cerumen left external auditory canal. Old healed fractures of the nasal bone noted. CT cervical spine: No fracture. Slight spondylolisthesis at C7-T1. Osteoarthritic change noted at several levels. Calcification in each carotid artery and distal left vertebral artery. Electronically Signed   By: Bretta Bang III M.D.   On: 01/26/2017 17:45   Dg Hip Unilat With Pelvis 2-3 Views Left  Result Date: 01/26/2017 CLINICAL DATA:  Unwitnessed fall. EXAM: DG HIP (WITH OR WITHOUT PELVIS) 2-3V LEFT COMPARISON:  January 07, 2016 FINDINGS: There is no evidence of hip fracture or dislocation. There is no evidence of arthropathy or other focal bone abnormality. IMPRESSION: Negative. Electronically Signed   By: Gerome Sam III  M.D   On: 01/26/2017 18:02    ____________________________________________   PROCEDURES Procedures  ____________________________________________   DIFFERENTIAL DIAGNOSIS  Subdural hematoma, epidural hematoma, skull fracture, C-spine fracture  CLINICAL IMPRESSION / ASSESSMENT AND PLAN / ED COURSE  Pertinent labs & imaging results that were available during my care of the patient were reviewed by me and considered in my medical decision making (see chart for details).   Patient well-appearing no acute distress, unremarkable vital signs, unremarkable exam. Presents with unwitnessed fall and forehead trauma. Concern for intracranial hemorrhage  versus extension injury of the spine with this mechanism. However, CT scans of the head and neck were unremarkable. Exam is reassuring, and patient is suitable for outpatient follow-up with her doctor. On my exam and not able to elicit pain or tenderness or any other concerning findings anywhere else, no suspicion for any other traumatic injuries. Low suspicion for a precipitating event such as ACS PE dissection pneumothorax pneumonia or UTI intra-abdominal pathology or sepsis.      ____________________________________________   FINAL CLINICAL IMPRESSION(S) / ED DIAGNOSES    Final diagnoses:  Fall, initial encounter  Hematoma of scalp, initial encounter      This SmartLink is deprecated. Use AVSMEDLIST instead to display the medication list for a patient.   Portions of this note were generated with dragon dictation software. Dictation errors may occur despite best attempts at proofreading.    Sharman CheekStafford, Gladine Plude, MD 01/26/17 1900

## 2017-01-26 NOTE — ED Triage Notes (Signed)
[facility].  Pt fell and was found asleep on the floor.  Fall unwitnessed.  Pt presents with large hematoma to R forehead.  Pt appears to be at baseline mentality per [facility].  Pt unable to answer questions well for this RN at this time.  Unknown if family to come with patient to ER.  Pt in NAD.

## 2017-02-28 ENCOUNTER — Inpatient Hospital Stay
Admission: EM | Admit: 2017-02-28 | Discharge: 2017-03-03 | DRG: 480 | Disposition: A | Discharge status: Nursing Home (Includes ICF) | Attending: Internal Medicine | Admitting: Internal Medicine

## 2017-02-28 ENCOUNTER — Emergency Department

## 2017-02-28 ENCOUNTER — Encounter: Payer: Self-pay | Admitting: Internal Medicine

## 2017-02-28 ENCOUNTER — Other Ambulatory Visit: Payer: Self-pay

## 2017-02-28 DIAGNOSIS — Z66 Do not resuscitate: Secondary | ICD-10-CM | POA: Diagnosis present

## 2017-02-28 DIAGNOSIS — W1830XA Fall on same level, unspecified, initial encounter: Secondary | ICD-10-CM | POA: Diagnosis present

## 2017-02-28 DIAGNOSIS — F329 Major depressive disorder, single episode, unspecified: Secondary | ICD-10-CM | POA: Diagnosis present

## 2017-02-28 DIAGNOSIS — E43 Unspecified severe protein-calorie malnutrition: Secondary | ICD-10-CM | POA: Diagnosis present

## 2017-02-28 DIAGNOSIS — Y92122 Bedroom in nursing home as the place of occurrence of the external cause: Secondary | ICD-10-CM

## 2017-02-28 DIAGNOSIS — S72141A Displaced intertrochanteric fracture of right femur, initial encounter for closed fracture: Principal | ICD-10-CM | POA: Diagnosis present

## 2017-02-28 DIAGNOSIS — Z7401 Bed confinement status: Secondary | ICD-10-CM

## 2017-02-28 DIAGNOSIS — Z79899 Other long term (current) drug therapy: Secondary | ICD-10-CM | POA: Diagnosis not present

## 2017-02-28 DIAGNOSIS — E876 Hypokalemia: Secondary | ICD-10-CM | POA: Diagnosis present

## 2017-02-28 DIAGNOSIS — R011 Cardiac murmur, unspecified: Secondary | ICD-10-CM | POA: Diagnosis present

## 2017-02-28 DIAGNOSIS — Z681 Body mass index (BMI) 19 or less, adult: Secondary | ICD-10-CM | POA: Diagnosis not present

## 2017-02-28 DIAGNOSIS — Z7982 Long term (current) use of aspirin: Secondary | ICD-10-CM | POA: Diagnosis not present

## 2017-02-28 DIAGNOSIS — S72009A Fracture of unspecified part of neck of unspecified femur, initial encounter for closed fracture: Secondary | ICD-10-CM | POA: Diagnosis present

## 2017-02-28 DIAGNOSIS — F0390 Unspecified dementia without behavioral disturbance: Secondary | ICD-10-CM | POA: Diagnosis present

## 2017-02-28 DIAGNOSIS — K0889 Other specified disorders of teeth and supporting structures: Secondary | ICD-10-CM | POA: Diagnosis present

## 2017-02-28 LAB — BASIC METABOLIC PANEL
ANION GAP: 11 (ref 5–15)
Anion gap: 10 (ref 5–15)
Anion gap: 10 (ref 5–15)
BUN: 13 mg/dL (ref 6–20)
BUN: 12 mg/dL (ref 6–20)
BUN: 15 mg/dL (ref 6–20)
CALCIUM: 8.7 mg/dL — AB (ref 8.9–10.3)
CALCIUM: 7.3 mg/dL — AB (ref 8.9–10.3)
CALCIUM: 8.7 mg/dL — AB (ref 8.9–10.3)
CO2: 26 mmol/L (ref 22–32)
CO2: 19 mmol/L — ABNORMAL LOW (ref 22–32)
CO2: 19 mmol/L — ABNORMAL LOW (ref 22–32)
CREATININE: 0.71 mg/dL (ref 0.44–1.00)
CREATININE: 1.02 mg/dL — AB (ref 0.44–1.00)
Chloride: 104 mmol/L (ref 101–111)
Chloride: 111 mmol/L (ref 101–111)
Chloride: 107 mmol/L (ref 101–111)
Creatinine, Ser: 0.55 mg/dL (ref 0.44–1.00)
GFR calc Af Amer: >60 mL/min (ref >60)
GFR calc Af Amer: 52 mL/min — ABNORMAL LOW (ref >60)
GFR, EST NON AFRICAN AMERICAN: 45 mL/min — AB (ref >60)
GLUCOSE: 211 mg/dL — AB (ref 65–99)
Glucose, Bld: 136 mg/dL — ABNORMAL HIGH (ref 65–99)
Glucose, Bld: 111 mg/dL — ABNORMAL HIGH (ref 65–99)
Potassium: 3.7 mmol/L (ref 3.5–5.1)
Potassium: 2.6 mmol/L — CL (ref 3.5–5.1)
Potassium: 4.5 mmol/L (ref 3.5–5.1)
SODIUM: 140 mmol/L (ref 135–145)
SODIUM: 136 mmol/L (ref 135–145)
Sodium: 141 mmol/L (ref 135–145)

## 2017-02-28 LAB — CBC
HCT: 36.7 % (ref 35.0–47.0)
HCT: 31.1 % — ABNORMAL LOW (ref 35.0–47.0)
HEMOGLOBIN: 12 g/dL (ref 12.0–16.0)
HEMOGLOBIN: 10 g/dL — AB (ref 12.0–16.0)
MCH: 27.3 pg (ref 26.0–34.0)
MCH: 27.3 pg (ref 26.0–34.0)
MCHC: 32.5 g/dL (ref 32.0–36.0)
MCHC: 32.2 g/dL (ref 32.0–36.0)
MCV: 84.1 fL (ref 80.0–100.0)
MCV: 84.8 fL (ref 80.0–100.0)
PLATELETS: 280 10*3/uL (ref 150–440)
PLATELETS: 275 10*3/uL (ref 150–440)
RBC: 4.37 MIL/uL (ref 3.80–5.20)
RBC: 3.67 MIL/uL — ABNORMAL LOW (ref 3.80–5.20)
RDW: 14.5 % (ref 11.5–14.5)
RDW: 14.6 % — AB (ref 11.5–14.5)
WBC: 7.9 10*3/uL (ref 3.6–11.0)
WBC: 13 10*3/uL — ABNORMAL HIGH (ref 3.6–11.0)

## 2017-02-28 LAB — PROTIME-INR
INR: 1.14
PROTHROMBIN TIME: 14.5 s (ref 11.4–15.2)

## 2017-02-28 LAB — TROPONIN I

## 2017-02-28 LAB — MRSA PCR SCREENING: MRSA BY PCR: NEGATIVE

## 2017-02-28 MED ORDER — MORPHINE SULFATE (PF) 2 MG/ML IV SOLN
INTRAVENOUS | Status: AC
  Filled 2017-02-28: qty 1

## 2017-02-28 MED ORDER — BUSPIRONE HCL 10 MG PO TABS
5.0000 mg | ORAL_TABLET | Freq: Two times a day (BID) | ORAL | Status: DC
  Filled 2017-02-28 (×4): qty 1

## 2017-02-28 MED ORDER — MAGNESIUM SULFATE 2 GM/50ML IV SOLN
2.0000 g | INTRAVENOUS | Status: AC
Start: 2017-02-28 — End: 2017-02-28
  Administered 2017-02-28: 2 g via INTRAVENOUS
  Filled 2017-02-28: qty 50

## 2017-02-28 MED ORDER — MELATONIN 3 MG PO TABS: 3.0000 mg | ORAL_TABLET | Freq: Every day | ORAL | Status: DC

## 2017-02-28 MED ORDER — MORPHINE SULFATE (PF) 2 MG/ML IV SOLN
1.0000 mg | INTRAVENOUS | Status: DC | PRN
  Administered 2017-02-28 (×2): 1 mg via INTRAVENOUS
  Filled 2017-02-28: qty 1

## 2017-02-28 MED ORDER — SENNOSIDES-DOCUSATE SODIUM 8.6-50 MG PO TABS: 1.0000 | ORAL_TABLET | Freq: Every evening | ORAL | Status: DC | PRN

## 2017-02-28 MED ORDER — POTASSIUM CHLORIDE 20 MEQ PO PACK
40.0000 meq | PACK | Freq: Two times a day (BID) | ORAL | Status: DC
  Filled 2017-02-28: qty 2

## 2017-02-28 MED ORDER — DEXTROSE-NACL 5-0.9 % IV SOLN: INTRAVENOUS | Status: DC

## 2017-02-28 MED ORDER — ACETAMINOPHEN 325 MG PO TABS: 650.0000 mg | ORAL_TABLET | Freq: Four times a day (QID) | ORAL | Status: DC | PRN

## 2017-02-28 MED ORDER — ACETAMINOPHEN 650 MG RE SUPP: 650.0000 mg | Freq: Four times a day (QID) | RECTAL | Status: DC | PRN

## 2017-02-28 MED ORDER — MELATONIN 5 MG PO TABS
2.5000 mg | ORAL_TABLET | Freq: Every day | ORAL | Status: DC
  Filled 2017-02-28 (×2): qty 0.5

## 2017-02-28 MED ORDER — HYDROCODONE-ACETAMINOPHEN 5-325 MG PO TABS: 1.0000 | ORAL_TABLET | ORAL | Status: DC | PRN

## 2017-02-28 MED ORDER — CHLORHEXIDINE GLUCONATE 4 % EX LIQD: Freq: Once | CUTANEOUS | Status: DC

## 2017-02-28 MED ORDER — ONDANSETRON HCL 4 MG PO TABS: 4.0000 mg | ORAL_TABLET | Freq: Four times a day (QID) | ORAL | Status: DC | PRN

## 2017-02-28 MED ORDER — POTASSIUM CHLORIDE CRYS ER 20 MEQ PO TBCR
40.0000 meq | EXTENDED_RELEASE_TABLET | ORAL | Status: DC
  Filled 2017-02-28: qty 2

## 2017-02-28 MED ORDER — ENSURE ENLIVE PO LIQD
237.0000 mL | Freq: Three times a day (TID) | ORAL | Status: DC
  Administered 2017-02-28: 237 mL via ORAL

## 2017-02-28 MED ORDER — FLUTICASONE PROPIONATE 50 MCG/ACT NA SUSP
1.0000 | Freq: Every day | NASAL | Status: DC
  Filled 2017-02-28: qty 16

## 2017-02-28 MED ORDER — POTASSIUM CHLORIDE 10 MEQ/100ML IV SOLN
10.0000 meq | INTRAVENOUS | Status: AC
  Administered 2017-02-28: 10 meq via INTRAVENOUS
  Filled 2017-02-28: qty 100

## 2017-02-28 MED ORDER — SODIUM CHLORIDE 0.9 % IV SOLN
INTRAVENOUS | Status: DC
  Administered 2017-02-28: 06:00:00 via INTRAVENOUS

## 2017-02-28 MED ORDER — HYDRALAZINE HCL 20 MG/ML IJ SOLN: 10.0000 mg | INTRAMUSCULAR | Status: DC | PRN

## 2017-02-28 MED ORDER — SERTRALINE HCL 50 MG PO TABS
100.0000 mg | ORAL_TABLET | Freq: Every day | ORAL | Status: DC
  Filled 2017-02-28: qty 2

## 2017-02-28 MED ORDER — ONDANSETRON HCL 4 MG/2ML IJ SOLN: 4.0000 mg | Freq: Four times a day (QID) | INTRAMUSCULAR | Status: DC | PRN

## 2017-02-28 NOTE — Clinical Social Work Note (Signed)
Clinical Social Work Assessment  Patient Details  Name: Marie Vega MRN: [id number] Date of Birth: 11-May-1919  Date of referral:  02/28/17               Reason for consult:  Other (Comment Required)(From [facility]. )                Permission sought to share information with:  Facility Art therapist granted to share information::  Yes, Verbal Permission Granted  Name::        Agency::     Relationship::     Contact Information:     Housing/Transportation Living arrangements for the past 2 months:  [place] of Information:  Adult Children Patient Interpreter Needed:  None Criminal Activity/Legal Involvement Pertinent to Current Situation/Hospitalization:  No - Comment as needed Significant Relationships:  Adult Children Lives with:  Facility Resident Do you feel safe going back to the place where you live?    Need for family participation in patient care:  Yes (Comment)  Care giving concerns:  Patient is a resident at [facility] followed by [facility].    Social Worker assessment / plan:  Holiday representative (CSW) reviewed chart and noted that patient is from [facility]. CSW met with patient and her son Marie Vega and daughter in law Marie Vega were at bedside. Patient was asleep and did not participate in assessment. Per son and daughter in law patient is in the memory care unit at [facility] and is private pay. Per son patient does not have medicaid. Per son and daughter in law patient will have surgery tomorrow and they want to focus on comfort not rehab. CSW explained that if patient were to go to SNF for a higher level of care she would have to stop hospice and go under her medicare for short term rehab, which requires a 3 night inpatient qualifying stay in a hospital. Patient's son reported that they can't pay privately for SNF.  Son and daughter in law prefer for patient to return  to [facility] memory care with hospice. Per daughter in law patient was not walking at baseline and could only pivot. CSW will contact [facility] tomorrow to see if they are able to accept patient back. CSW will continue to follow and assist as needed.   Employment status:  Disabled (Comment on whether or not currently receiving Disability) Insurance information:  Medicare PT Recommendations:  Not assessed at this time Information / Referral to community resources:  Other (Comment Required)(Patient's family wants her to return to ALF. )  Patient/Family's Response to care:  Patient's son and daughter in law prefer for patient to return to [facility].   Patient/Family's Understanding of and Emotional Response to Diagnosis, Current Treatment, and Prognosis:  Patient's son and daughter in law were very pleasant and thanked CSW for assistance.   Emotional Assessment Appearance:  Appears stated age Attitude/Demeanor/Rapport:  Unable to Assess Affect (typically observed):  Unable to Assess Orientation:  Oriented to Self, Fluctuating Orientation (Suspected and/or reported Sundowners) Alcohol / Substance use:  Not Applicable Psych involvement (Current and /or in the community):  No (Comment)  Discharge Needs  Concerns to be addressed:  Discharge Planning Concerns Readmission within the last 30 days:  No Current discharge risk:  Chronically ill, Cognitively Impaired, Dependent with Mobility Barriers to Discharge:  Continued Medical Work up   [employer], Veronia Beets, LCSW 02/28/2017, 8:31 PM

## 2017-02-28 NOTE — Consult Note (Signed)
ORTHOPAEDIC CONSULTATION  REQUESTING PHYSICIAN: Alford Highland, MD  Chief Complaint: Right hip pain  HPI: Marie Vega is a 84 y.o. female who complains of right hip pain after a fall last night at her nursing facility.  Patient was brought to the emergency room where exam and x-rays reveal a mildly displaced intratrochanteric fracture of the right hip.  He has been admitted for medical evaluation and possible surgical intervention.  Patient is not competent to agree to surgery herself.  I have called her son who had a voicemail only.  I left a detailed message for him and hope to speak to him later today.  The patient potassium level this morning was 2.6 and so replacement is being done today.  We will plan to do surgery tomorrow morning if the family wishes this.  Past Medical History:  Diagnosis Date  . Dementia   . Depression   . Syphilis    Past Surgical History:  Procedure Laterality Date  . none     Social History   Socioeconomic History  . Marital status: Widowed    Spouse name: Not on file  . Number of children: Not on file  . Years of education: Not on file  . Highest education level: Not on file  Social Needs  . Financial resource strain: Not on file  . Food insecurity - worry: Not on file  . Food insecurity - inability: Not on file  . Transportation needs - medical: Not on file  . Transportation needs - non-medical: Not on file  Occupational History  . Occupation: retired  Tobacco Use  . Smoking status: Never Smoker  . Smokeless tobacco: Never Used  Substance and Sexual Activity  . Alcohol use: No  . Drug use: No  . Sexual activity: No  Other Topics Concern  . Not on file  Social History Narrative  . Not on file   Family History  Problem Relation Age of Onset  . Hypertension Other    No Known Allergies Prior to Admission medications   Medication Sig Start Date End Date Taking? Authorizing Provider  acetaminophen (TYLENOL) 500 MG tablet Take 500 mg  by mouth at bedtime as needed for mild pain.    Yes [provider]  aspirin 81 MG chewable tablet Chew 81 mg by mouth daily.   Yes [provider]  busPIRone (BUSPAR) 5 MG tablet Take 5 mg by mouth 2 (two) times daily.   Yes [provider]  fluticasone (FLONASE) 50 MCG/ACT nasal spray Place 1 spray into both nostrils daily.   Yes [provider]  ibuprofen (ADVIL,MOTRIN) 400 MG tablet Take 400 mg by mouth 2 (two) times daily as needed for mild pain.    Yes [provider]  Melatonin 3 MG TABS Take 3 mg by mouth at bedtime.    Yes [provider]  sertraline (ZOLOFT) 100 MG tablet Take 100 mg by mouth at bedtime.    Yes [provider]  feeding supplement, ENSURE ENLIVE, (ENSURE ENLIVE) LIQD Take 237 mLs by mouth 3 (three) times daily between meals. 10/14/15   Altamese Dilling, MD   Ct Head Wo Contrast  Result Date: 02/28/2017 CLINICAL DATA:  84 year old post unwitnessed fall from bed. Syncope, no other cardiac symptoms EXAM: CT HEAD WITHOUT CONTRAST TECHNIQUE: Contiguous axial images were obtained from the base of the skull through the vertex without intravenous contrast. COMPARISON:  Head CT 01/26/2017 FINDINGS: Brain: Stable degree of atrophy and chronic small vessel ischemia no intracranial  hemorrhage, mass effect, or midline shift. No hydrocephalus. The basilar cisterns are patent. No evidence of territorial infarct or acute ischemia. No extra-axial or intracranial fluid collection. Vascular: Atherosclerosis of skullbase vasculature without hyperdense vessel or abnormal calcification. Skull: No fracture or focal lesion. Sinuses/Orbits: Probable bilateral cataract resection. Sclerosis about left side of sphenoid sinus suggests prior paranasal sinus disease, no acute inflammation. Other: None. IMPRESSION: 1.  No acute intracranial abnormality.  No skull fracture. 2. Unchanged atrophy and chronic small vessel ischemia. Electronically  Signed   By: Rubye OaksMelanie  Ehinger M.D.   On: 02/28/2017 04:34   Dg Hip Unilat W Or Wo Pelvis 2-3 Views Right  Result Date: 02/28/2017 CLINICAL DATA:  84 year old with right hip pain after fall. EXAM: DG HIP (WITH OR WITHOUT PELVIS) 2-3V RIGHT COMPARISON:  None. FINDINGS: Intertrochanteric fracture is displaced with displacement involving a fragment containing the lesser trochanter spanning 18 mm. Minimal proximal migration of the femoral shaft. Femoral head remains seated. No additional fracture of the bony pelvis. The pubic rami are intact. Bones are under mineralized. There are vascular calcifications. IMPRESSION: Displaced intertrochanteric right hip fracture. Electronically Signed   By: Rubye OaksMelanie  Ehinger M.D.   On: 02/28/2017 04:27    Positive ROS: All other systems have been reviewed and were otherwise negative with the exception of those mentioned in the HPI and as above.  Physical Exam: General: Alert, no acute distress Cardiovascular: No pedal edema Respiratory: No cyanosis, no use of accessory musculature GI: No organomegaly, abdomen is soft and non-tender Skin: No lesions in the area of chief complaint Neurologic: Sensation intact distally Psychiatric: Patient is competent for consent with normal mood and affect Lymphatic: No axillary or cervical lymphadenopathy  MUSCULOSKELETAL: The right leg is shortened and rotated.  There is pain with movement of the hip.  Neurovascular status is good.  No ecchymosis.  No other orthopedic injuries are noted.  Assessment: Intertrochanteric fracture right hip  Plan: Fixation of right hip if the family wishes and after the potassium has been replaced    Valinda HoarMILLER,Sherleen Pangborn E, MD [phone]   02/28/2017 12:35 PM

## 2017-02-28 NOTE — ED Notes (Signed)
Pt. Returned to tx. room in stable condition with no acute changes since departure from unit for scans.   

## 2017-02-28 NOTE — ED Notes (Signed)
Floor trying to find low bed for traction for pt. Floor will call this RN when able to take pt on floor

## 2017-02-28 NOTE — Care Management (Signed)
Patient is not open to [facility]. She is a hospice patient with [facility]. Possible surgery today

## 2017-02-28 NOTE — Progress Notes (Signed)
Pt alert but confused. Resting in room. Family at bedside. Pt potassium level now 4.5. Verbal orders from MD [place] to hold remaining potassium IV. Pt transferred to low bed. IV saline locked. Pt on room air. Bucks traction in place with 5 lbs. Will continue to monitor.

## 2017-02-28 NOTE — NC FL2 (Signed)
[place] MEDICAID FL2 LEVEL OF CARE SCREENING TOOL     IDENTIFICATION  Patient Name: Marie Vega Birthdate: [birth year] Sex: female Admission Date (Current Location): 02/28/2017  [facility] and [place] Number:  Chiropodist and Address:  [facility], [address], [place], [place] [address]      Provider Number: [id number]  Attending Physician Name and Address:  Alford Highland, MD  Relative Name and Phone Number:       Current Level of Care: Hospital Recommended Level of Care: Assisted Living Facility, Memory Care Prior Approval Number:    Date Approved/Denied:   PASRR Number:    Discharge Plan: Domiciliary (Rest home)(Memory Care )    Current Diagnoses: Patient Active Problem List   Diagnosis Date Noted  . Hip fracture (HCC) 02/28/2017  . Acute encephalopathy 10/13/2015    Orientation RESPIRATION BLADDER Height & Weight     Self  Normal Incontinent Weight: 99 lb (44.9 kg) Height:  5\' 7"  (170.2 cm)  BEHAVIORAL SYMPTOMS/MOOD NEUROLOGICAL BOWEL NUTRITION STATUS      Continent Diet(Diet: DYS 3 )  AMBULATORY STATUS COMMUNICATION OF NEEDS Skin   Extensive Assist Verbally Surgical wounds                       Personal Care Assistance Level of Assistance  Bathing, Feeding, Dressing Bathing Assistance: Limited assistance Feeding assistance: Independent Dressing Assistance: Limited assistance     Functional Limitations Info  Hearing, Sight, Speech Sight Info: Adequate Hearing Info: Adequate Speech Info: Adequate    SPECIAL CARE FACTORS FREQUENCY  (Followed by [facility]. )                    Contractures      Additional Factors Info  Code Status, Allergies Code Status Info: (DNR ) Allergies Info: (No Known Allergies. )           Current Medications (02/28/2017):  This is the current hospital active medication list Current Facility-Administered Medications  Medication Dose Route Frequency Provider Last  Rate Last Dose  . acetaminophen (TYLENOL) tablet 650 mg  650 mg Oral Q6H PRN Ihor Austin, MD       Or  . acetaminophen (TYLENOL) suppository 650 mg  650 mg Rectal Q6H PRN Pyreddy, Vivien Rota, MD      . busPIRone (BUSPAR) tablet 5 mg  5 mg Oral BID Pyreddy, Pavan, MD      . chlorhexidine (HIBICLENS) 4 % liquid   Topical Once Deeann Saint, MD      . dextrose 5 %-0.9 % sodium chloride infusion   Intravenous Continuous Pyreddy, Pavan, MD      . feeding supplement (ENSURE ENLIVE) (ENSURE ENLIVE) liquid 237 mL  237 mL Oral TID BM Pyreddy, Pavan, MD   237 mL at 02/28/17 1521  . fluticasone (FLONASE) 50 MCG/ACT nasal spray 1 spray  1 spray Each Nare Daily Pyreddy, Pavan, MD      . hydrALAZINE (APRESOLINE) injection 10 mg  10 mg Intravenous Q4H PRN Pyreddy, Pavan, MD      . HYDROcodone-acetaminophen (NORCO/VICODIN) 5-325 MG per tablet 1-2 tablet  1-2 tablet Oral Q4H PRN Pyreddy, Vivien Rota, MD      . Melatonin TABS 2.5 mg  2.5 mg Oral QHS Pyreddy, Pavan, MD      . morphine 2 MG/ML injection 1 mg  1 mg Intravenous Q4H PRN Ihor Austin, MD   1 mg at 02/28/17 1547  . ondansetron (ZOFRAN) tablet 4 mg  4 mg Oral Q6H PRN Pyreddy, Pavan, MD       Or  . ondansetron (ZOFRAN) injectioIhor Austinn 4 mg  4 mg Intravenous Q6H PRN Pyreddy, Pavan, MD      . potassium chloride (KLOR-CON) packet 40 mEq  40 mEq Oral BID Deeann SaintMiller, Howard, MD      . potassium chloride SA (K-DUR,KLOR-CON) CR tablet 40 mEq  40 mEq Oral STAT Wieting, Richard, MD      . senna-docusate (Senokot-S) tablet 1 tablet  1 tablet Oral QHS PRN Ihor AustinPyreddy, Pavan, MD      . sertraline (ZOLOFT) tablet 100 mg  100 mg Oral QHS Ihor AustinPyreddy, Pavan, MD         Discharge Medications: Please see discharge summary for a list of discharge medications.  Relevant Imaging Results:  Relevant Lab Results:   Additional Information (SSN: [id number])  Terrion Gencarelli, Darleen CrockerBailey M, LCSW

## 2017-02-28 NOTE — ED Triage Notes (Signed)
Pt bib [facility] for unwitnessed fall from bed. Pt states tripped and fell, pt is poor historian with hx dementia. Facility reported to EMS pt is at baseline mentation. EMS reported pt did not hit her head and no blood thinners. EDP at bedside.

## 2017-02-28 NOTE — Care Management (Signed)
This patient will need a resumption of care order for home health services.  I have received notification that patient is from a [facility] ALF followed by [facility]. Rep is Gardner CandleBrownlee Bryant - [email or web address].

## 2017-02-28 NOTE — ED Notes (Signed)
Floor unable to take report at this time.

## 2017-02-28 NOTE — H&P (Signed)
[facility] [place] [facility]   PATIENT NAME: Marie Vega    MR#:  [id number]  DATE OF BIRTH:  07-May-1919  DATE OF ADMISSION:  02/28/2017  PRIMARY CARE PHYSICIAN: Randie Heinz, NP   REQUESTING/REFERRING PHYSICIAN:   CHIEF COMPLAINT:   Chief Complaint  Patient presents with  . Fall    HISTORY OF PRESENT ILLNESS: Marie Vega  is a 84 y.o. female with a known history of depression, dementia who is a resident of [facility] memory care unit presented to the emergency room for fall.  Patient lost balance and fell down and has pain in the right hip.  Patient was worked up with CT head which showed no acute intracranial abnormality.  X-rays of the pelvis showed right intertrochanteric hip fracture.  Patient has tenderness in the right hip she is 6 out of 10 on a scale of 1-10.  No complaints of any chest pain, shortness of breath.  PAST MEDICAL HISTORY:   Past Medical History:  Diagnosis Date  . Dementia   . Depression   . Syphilis     PAST SURGICAL HISTORY:  Past Surgical History:  Procedure Laterality Date  . none      SOCIAL HISTORY:  Social History   Tobacco Use  . Smoking status: Never Smoker  . Smokeless tobacco: Never Used  Substance Use Topics  . Alcohol use: No    FAMILY HISTORY:  Family History  Problem Relation Age of Onset  . Hypertension Other     DRUG ALLERGIES: No Known Allergies  REVIEW OF SYSTEMS:   CONSTITUTIONAL: No fever, fatigue or weakness.  EYES: No blurred or double vision.  EARS, NOSE, AND THROAT: No tinnitus or ear pain.  RESPIRATORY: No cough, shortness of breath, wheezing or hemoptysis.  CARDIOVASCULAR: No chest pain, orthopnea, edema.  GASTROINTESTINAL: No nausea, vomiting, diarrhea or abdominal pain.  GENITOURINARY: No dysuria, hematuria.  ENDOCRINE: No polyuria, nocturia,  HEMATOLOGY: No anemia, easy bruising or bleeding SKIN: No rash or lesion. MUSCULOSKELETAL: Has right hip  pain NEUROLOGIC: No tingling, numbness, weakness.  PSYCHIATRY: No anxiety or depression.   MEDICATIONS AT HOME:  Prior to Admission medications   Medication Sig Start Date End Date Taking? Authorizing Provider  acetaminophen (TYLENOL) 500 MG tablet Take 500 mg by mouth at bedtime as needed for mild pain.     [provider]  aspirin 81 MG chewable tablet Chew 81 mg by mouth daily.    [provider]  busPIRone (BUSPAR) 5 MG tablet Take 5 mg by mouth 2 (two) times daily.    [provider]  feeding supplement, ENSURE ENLIVE, (ENSURE ENLIVE) LIQD Take 237 mLs by mouth 3 (three) times daily between meals. 10/14/15   Altamese Dilling, MD  fluticasone (FLONASE) 50 MCG/ACT nasal spray Place 1 spray into both nostrils daily.    [provider]  ibuprofen (ADVIL,MOTRIN) 400 MG tablet Take 400 mg by mouth 2 (two) times daily as needed for mild pain.     [provider]  Melatonin 3 MG TABS Take 3 mg by mouth at bedtime.     [provider]  sertraline (ZOLOFT) 100 MG tablet Take 100 mg by mouth at bedtime.     [provider]      PHYSICAL EXAMINATION:   VITAL SIGNS: Blood pressure (!) 173/69, pulse 70, temperature 98.3 F (36.8 C), temperature source Oral, resp. rate 19, height 5\' 7"  (1.702 m), weight 44.9 kg (99 lb), SpO2 95 %.  GENERAL:  84 y.o.-year-old patient lying in the bed with no acute distress.  EYES: Pupils equal, round, reactive to light and accommodation. No scleral icterus. Extraocular muscles intact.  HEENT: Head atraumatic, normocephalic. Oropharynx and nasopharynx clear.  NECK:  Supple, no jugular venous distention. No thyroid enlargement, no tenderness.  LUNGS: Normal breath sounds bilaterally, no wheezing, rales,rhonchi or crepitation. No use of accessory muscles of respiration.  CARDIOVASCULAR: S1, S2 normal. No murmurs, rubs, or gallops.  ABDOMEN: Soft, nontender, nondistended. Bowel sounds present. No  organomegaly or mass.  EXTREMITIES: No pedal edema, cyanosis, or clubbing.  Tenderness right hip NEUROLOGIC: Cranial nerves II through XII are intact. Muscle strength 5/5 in all extremities. Sensation intact. Gait not checked.  PSYCHIATRIC: The patient is alert and oriented x 3.  SKIN: No obvious rash, lesion, or ulcer.   LABORATORY PANEL:   CBC Recent Labs  Lab 02/28/17 0320  WBC 7.9  HGB 12.0  HCT 36.7  PLT 280  MCV 84.1  MCH 27.3  MCHC 32.5  RDW 14.5   ------------------------------------------------------------------------------------------------------------------  Chemistries  Recent Labs  Lab 02/28/17 0320  NA 140  K 3.7  CL 104  CO2 26  GLUCOSE 136*  BUN 13  CREATININE 0.71  CALCIUM 8.7*   ------------------------------------------------------------------------------------------------------------------ estimated creatinine clearance is 29.2 mL/min (by C-G formula based on SCr of 0.71 mg/dL). ------------------------------------------------------------------------------------------------------------------ No results for input(s): TSH, T4TOTAL, T3FREE, THYROIDAB in the last 72 hours.  Invalid input(s): FREET3   Coagulation profile No results for input(s): INR, PROTIME in the last 168 hours. ------------------------------------------------------------------------------------------------------------------- No results for input(s): DDIMER in the last 72 hours. -------------------------------------------------------------------------------------------------------------------  Cardiac Enzymes Recent Labs  Lab 02/28/17 0320  TROPONINI <0.03   ------------------------------------------------------------------------------------------------------------------ Invalid input(s): POCBNP  ---------------------------------------------------------------------------------------------------------------  Urinalysis    Component Value Date/Time   COLORURINE YELLOW  (A) 01/18/2016 1423   APPEARANCEUR CLEAR (A) 01/18/2016 1423   LABSPEC 1.014 01/18/2016 1423   PHURINE 6.0 01/18/2016 1423   GLUCOSEU NEGATIVE 01/18/2016 1423   HGBUR NEGATIVE 01/18/2016 1423   BILIRUBINUR NEGATIVE 01/18/2016 1423   KETONESUR NEGATIVE 01/18/2016 1423   PROTEINUR NEGATIVE 01/18/2016 1423   NITRITE NEGATIVE 01/18/2016 1423   LEUKOCYTESUR NEGATIVE 01/18/2016 1423     RADIOLOGY: Ct Head Wo Contrast  Result Date: 02/28/2017 CLINICAL DATA:  84 year old post unwitnessed fall from bed. Syncope, no other cardiac symptoms EXAM: CT HEAD WITHOUT CONTRAST TECHNIQUE: Contiguous axial images were obtained from the base of the skull through the vertex without intravenous contrast. COMPARISON:  Head CT 01/26/2017 FINDINGS: Brain: Stable degree of atrophy and chronic small vessel ischemia no intracranial hemorrhage, mass effect, or midline shift. No hydrocephalus. The basilar cisterns are patent. No evidence of territorial infarct or acute ischemia. No extra-axial or intracranial fluid collection. Vascular: Atherosclerosis of skullbase vasculature without hyperdense vessel or abnormal calcification. Skull: No fracture or focal lesion. Sinuses/Orbits: Probable bilateral cataract resection. Sclerosis about left side of sphenoid sinus suggests prior paranasal sinus disease, no acute inflammation. Other: None. IMPRESSION: 1.  No acute intracranial abnormality.  No skull fracture. 2. Unchanged atrophy and chronic small vessel ischemia. Electronically Signed   By: Rubye OaksMelanie  Ehinger M.D.   On: 02/28/2017 04:34   Dg Hip Unilat W Or Wo Pelvis 2-3 Views Right  Result Date: 02/28/2017 CLINICAL DATA:  84 year old with right hip pain after fall. EXAM: DG HIP (WITH OR WITHOUT PELVIS) 2-3V RIGHT COMPARISON:  None. FINDINGS: Intertrochanteric fracture is displaced with displacement involving a fragment containing the lesser trochanter spanning 18 mm. Minimal proximal migration of  the femoral shaft. Femoral head  remains seated. No additional fracture of the bony pelvis. The pubic rami are intact. Bones are under mineralized. There are vascular calcifications. IMPRESSION: Displaced intertrochanteric right hip fracture. Electronically Signed   By: Rubye Oaks M.D.   On: 02/28/2017 04:27    EKG: Orders placed or performed during the hospital encounter of 02/28/17  . EKG 12-Lead  . EKG 12-Lead  . ED EKG  . ED EKG    IMPRESSION AND PLAN: 84 year old female patient who is a resident of [facility] facility with history of depression, dementia presented to the emergency room for fall and right hip pain.  Admitting diagnosis 1.  Intertrochanteric fracture of right hip 2.  Accidental fall 3.  Right hip pain 4.  Dementia Treatment plan Admit patient to medical floor Pain management with oral Percocet and as needed IV morphine Orthopedic surgery consultation Keep patient n.p.o. DNR by CODE STATUS DVT prophylaxis sequential compression device to lower extremities IV fluids Supportive care  All the records are reviewed and case discussed with ED provider. Management plans discussed with the patient, family and they are in agreement.  CODE STATUS:DNR    Code Status Orders  (From admission, onward)        Start     Ordered   02/28/17 0519  Do not attempt resuscitation (DNR)  Continuous    Question Answer Comment  In the event of cardiac or respiratory ARREST Do not call a "code blue"   In the event of cardiac or respiratory ARREST Do not perform Intubation, CPR, defibrillation or ACLS   In the event of cardiac or respiratory ARREST Use medication by any route, position, wound care, and other measures to relive pain and suffering. May use oxygen, suction and manual treatment of airway obstruction as needed for comfort.      02/28/17 0518    Code Status History    Date Active Date Inactive Code Status Order ID Comments User Context   10/13/2015 18:55 10/14/2015 21:25 Full Code  [id number]  Hower, Cletis [place], MD ED       TOTAL TIME TAKING CARE OF THIS PATIENT: 50 minutes.    Ihor Austin M.D on 02/28/2017 at 5:19 AM  Between 7am to 6pm - Pager - [phone]  After 6pm go to [email or web address] - password [email or web address] [facility]  [facility]  Office  [phone]  CC: Primary care physician; Randie Heinz, NP

## 2017-02-28 NOTE — Progress Notes (Signed)
Patient ID: Marie Vega, female   DOB: March 06, 1919, 84 y.o.   MRN: [id number]  [facility] PROGRESS NOTE  Marie Vega WUJ:[id number] DOB: 11/06/1919 DOA: 02/28/2017 PCP: Randie Heinz, NP  HPI/Subjective: Patient was sleeping when I saw her.  I gave her sternal rub and unable to wake her up.  Nursing staff stated she was able to talk a little bit earlier.  Family at the bedside when I was in there states that she is basically bedbound and she usually sleeps all afternoon.  Objective: Vitals:   02/28/17 0330 02/28/17 0748  BP: (!) 173/69 (!) 162/85  Pulse: 70 94  Resp: 19 16  Temp:  98.6 F (37 C)  SpO2: 95% 96%   No intake or output data in the 24 hours ending 02/28/17 1514 Filed Weights   02/28/17 0325  Weight: 44.9 kg (99 lb)    ROS: Review of Systems  Unable to perform ROS: Dementia   Exam: Physical Exam  Constitutional: She appears lethargic.  HENT:  Nose: No mucosal edema.  Unable to look into mouth.  Eyes: Conjunctivae and lids are normal. Pupils are equal, round, and reactive to light.  Neck: No JVD present. Carotid bruit is not present. No edema present. No thyroid mass and no thyromegaly present.  Cardiovascular: S1 normal and S2 normal. Exam reveals no gallop.  Murmur heard.  Systolic murmur is present with a grade of 2/6. Pulses:      Dorsalis pedis pulses are 2+ on the right side, and 2+ on the left side.  Respiratory: No respiratory distress. She has decreased breath sounds in the right lower field and the left lower field. She has no wheezes. She has no rhonchi. She has no rales.  GI: Soft. Bowel sounds are normal. There is no tenderness.  Musculoskeletal:       Right ankle: She exhibits no swelling.       Left ankle: She exhibits no swelling.  Lymphadenopathy:    She has no cervical adenopathy.  Neurological: She appears lethargic.  Skin: Skin is warm. No rash noted. Nails show no clubbing.  Psychiatric:  Unable to assess at this time.       Data Reviewed: Basic Metabolic Panel: Recent Labs  Lab 02/28/17 0320 02/28/17 0647 02/28/17 1106  NA 140 141 136  K 3.7 2.6* 4.5  CL 104 111 107  CO2 26 19* 19*  GLUCOSE 136* 211* 111*  BUN 13 12 15   CREATININE 0.71 0.55 1.02*  CALCIUM 8.7* 7.3* 8.7*   CBC: Recent Labs  Lab 02/28/17 0320 02/28/17 0647  WBC 7.9 13.0*  HGB 12.0 10.0*  HCT 36.7 31.1*  MCV 84.1 84.8  PLT 280 275   Cardiac Enzymes: Recent Labs  Lab 02/28/17 0320  TROPONINI <0.03     Recent Results (from the past 240 hour(s))  MRSA PCR Screening     Status: None   Collection Time: 02/28/17  7:44 AM  Result Value Ref Range Status   MRSA by PCR NEGATIVE NEGATIVE Final    Comment:        The GeneXpert MRSA Assay (FDA approved for NASAL specimens only), is one component of a comprehensive MRSA colonization surveillance program. It is not intended to diagnose MRSA infection nor to guide or monitor treatment for MRSA infections. Performed at [facility], [address], [place], [place] [address]      Studies: Ct Head Wo Contrast  Result Date: 02/28/2017 CLINICAL DATA:  84 year old post unwitnessed fall from  bed. Syncope, no other cardiac symptoms EXAM: CT HEAD WITHOUT CONTRAST TECHNIQUE: Contiguous axial images were obtained from the base of the skull through the vertex without intravenous contrast. COMPARISON:  Head CT 01/26/2017 FINDINGS: Brain: Stable degree of atrophy and chronic small vessel ischemia no intracranial hemorrhage, mass effect, or midline shift. No hydrocephalus. The basilar cisterns are patent. No evidence of territorial infarct or acute ischemia. No extra-axial or intracranial fluid collection. Vascular: Atherosclerosis of skullbase vasculature without hyperdense vessel or abnormal calcification. Skull: No fracture or focal lesion. Sinuses/Orbits: Probable bilateral cataract resection. Sclerosis about left side of sphenoid sinus suggests prior paranasal sinus  disease, no acute inflammation. Other: None. IMPRESSION: 1.  No acute intracranial abnormality.  No skull fracture. 2. Unchanged atrophy and chronic small vessel ischemia. Electronically Signed   By: Rubye Oaks M.D.   On: 02/28/2017 04:34   Dg Hip Unilat W Or Wo Pelvis 2-3 Views Right  Result Date: 02/28/2017 CLINICAL DATA:  84 year old with right hip pain after fall. EXAM: DG HIP (WITH OR WITHOUT PELVIS) 2-3V RIGHT COMPARISON:  None. FINDINGS: Intertrochanteric fracture is displaced with displacement involving a fragment containing the lesser trochanter spanning 18 mm. Minimal proximal migration of the femoral shaft. Femoral head remains seated. No additional fracture of the bony pelvis. The pubic rami are intact. Bones are under mineralized. There are vascular calcifications. IMPRESSION: Displaced intertrochanteric right hip fracture. Electronically Signed   By: Rubye Oaks M.D.   On: 02/28/2017 04:27    Scheduled Meds: . busPIRone  5 mg Oral BID  . chlorhexidine   Topical Once  . feeding supplement (ENSURE ENLIVE)  237 mL Oral TID BM  . fluticasone  1 spray Each Nare Daily  . Melatonin  2.5 mg Oral QHS  . morphine      . potassium chloride  40 mEq Oral BID  . potassium chloride  40 mEq Oral STAT  . sertraline  100 mg Oral QHS   Continuous Infusions: . dextrose 5 % and 0.9% NaCl      Assessment/Plan:  1. Preoperative evaluation for intertrochanteric closed fracture of the right hip.  No contraindications to surgery at this time.  The patient is a hospice patient and is DNR.  Surgery will help out with pain control because fracture pain is worse than postoperative pain. 2. Hypokalemia on this morning's labs.  I think this morning's labs at 647 was an error because the patient's glucose was higher, the patient's creatinine was lower, the patient's calcium was lower along with the patient's potassium being a lot lower.  Repeat potassium was 4.5 after only one run of potassium.   Recheck potassium tomorrow morning. 3. History of dementia and depression on melatonin and BuSpar and Zoloft.  Patient placed on dysphagia 3 diet and n.p.o. after midnight.  Code Status:     Code Status Orders  (From admission, onward)        Start     Ordered   02/28/17 0519  Do not attempt resuscitation (DNR)  Continuous    Question Answer Comment  In the event of cardiac or respiratory ARREST Do not call a "code blue"   In the event of cardiac or respiratory ARREST Do not perform Intubation, CPR, defibrillation or ACLS   In the event of cardiac or respiratory ARREST Use medication by any route, position, wound care, and other measures to relive pain and suffering. May use oxygen, suction and manual treatment of airway obstruction as needed for comfort.  02/28/17 0518    Code Status History    Date Active Date Inactive Code Status Order ID Comments User Context   10/13/2015 18:55 10/14/2015 21:25 Full Code [id number]  Hower, Cletis Athensavid K, MD ED     Family Communication: Son and his wife at the bedside Disposition Plan: Will likely need rehab 2-3 days postoperatively depending on clinical course  Consultants:  Orthopedic surgery  Time spent: 28 minutes  Marie Kerner [employer]  [employer]

## 2017-02-28 NOTE — ED Provider Notes (Signed)
[facility] Emergency Department Provider Note   First MD Initiated Contact with Patient 02/28/17 [phone]     (approximate)  I have reviewed the triage vital signs and the nursing notes.  Level 5 caveat: History limited secondary to dementia HISTORY  Chief Complaint Fall    HPI Marie Vega is a 84 y.o. female presents emergency department via EMS status post unwitnessed fall [facility].  Per EMS patient allegedly fell following getting out of bed.   Past Medical History:  Diagnosis Date  . Dementia   . Depression   . Syphilis     Patient Active Problem List   Diagnosis Date Noted  . Hip fracture (HCC) 02/28/2017  . Acute encephalopathy 10/13/2015    Past Surgical History:  Procedure Laterality Date  . none      Prior to Admission medications   Medication Sig Start Date End Date Taking? Authorizing Provider  acetaminophen (TYLENOL) 500 MG tablet Take 500 mg by mouth at bedtime as needed for mild pain.     [provider]  aspirin 81 MG chewable tablet Chew 81 mg by mouth daily.    [provider]  busPIRone (BUSPAR) 5 MG tablet Take 5 mg by mouth 2 (two) times daily.    [provider]  feeding supplement, ENSURE ENLIVE, (ENSURE ENLIVE) LIQD Take 237 mLs by mouth 3 (three) times daily between meals. 10/14/15   [name], [name], MD  fluticasone (FLONASE) 50 MCG/ACT nasal spray Place 1 spray into both nostrils daily.    [provider]  ibuprofen (ADVIL,MOTRIN) 400 MG tablet Take 400 mg by mouth 2 (two) times daily as needed for mild pain.     [provider]  Melatonin 3 MG TABS Take 3 mg by mouth at bedtime.     [provider]  sertraline (ZOLOFT) 100 MG tablet Take 100 mg by mouth at bedtime.     [provider]    Allergies No known drug allergies  Family History  Problem Relation Age of Onset  . Hypertension Other     Social History Social  History   Tobacco Use  . Smoking status: Never Smoker  . Smokeless tobacco: Never Used  Substance Use Topics  . Alcohol use: No  . Drug use: No    Review of Systems Constitutional: No fever/chills Eyes: No visual changes. ENT: No sore throat. Cardiovascular: Denies chest pain. Respiratory: Denies shortness of breath. Gastrointestinal: No abdominal pain.  No nausea, no vomiting.  No diarrhea.  No constipation. Genitourinary: Negative for dysuria. Musculoskeletal: Negative for neck pain.  Negative for back pain. Integumentary: Negative for rash. Neurological: Negative for headaches, focal weakness or numbness.   ____________________________________________   PHYSICAL EXAM:  VITAL SIGNS: ED Triage Vitals  Enc Vitals Group     BP 02/28/17 0315 (!) 193/68     Pulse Rate 02/28/17 0315 72     Resp 02/28/17 0324 19     Temp 02/28/17 0324 98.3 F (36.8 C)     Temp Source 02/28/17 0324 Oral     SpO2 02/28/17 0315 96 %     Weight 02/28/17 0325 44.9 kg (99 lb)     Height 02/28/17 0325 1.702 m (5\' 7" )     Head Circumference --      Peak Flow --      Pain Score --      Pain Loc --      Pain Edu? --  Excl. in GC? --     Constitutional: Alert and oriented. Well appearing and in no acute distress. Eyes: Conjunctivae are normal.  Head: Atraumatic. Mouth/Throat: Mucous membranes are moist.  Oropharynx non-erythematous. Neck: No stridor.No cervical spine tenderness to palpation. Cardiovascular: Normal rate, regular rhythm. Good peripheral circulation. Grossly normal heart sounds. Respiratory: Normal respiratory effort.  No retractions. Lungs CTAB. Gastrointestinal: Soft and nontender. No distention.  Musculoskeletal: Right leg internal rotation pain with palpation of the right hip neurologic: Nonsensical speech . No gross focal neurologic deficits are appreciated.  Skin:  Skin is warm, dry and intact. No rash noted.   ____________________________________________    LABS (all labs ordered are listed, but only abnormal results are displayed)  Labs Reviewed  BASIC METABOLIC PANEL - Abnormal; Notable for the following components:      Result Value   Glucose, Bld 136 (*)    Calcium 8.7 (*)    All other components within normal limits  CBC  TROPONIN I  PROTIME-INR  URINALYSIS, ROUTINE W REFLEX MICROSCOPIC  BASIC METABOLIC PANEL  CBC   ____________________________________________  EKG  ED ECG REPORT I, Barnard N Jenavive Lamboy, the attending physician, personally viewed and interpreted this ECG.   Date: 02/28/2017  EKG Time: 3:30 AM  Rate: 69  Rhythm: Normal sinus rhythm  Axis: Normal  Intervals: Normal  ST&T Change: None  ____________________________________________  RADIOLOGY I, Halbur N Davelle Anselmi, personally viewed and evaluated these images (plain radiographs) as part of my medical decision making, as well as reviewing the written report by the radiologist.  Ct Head Wo Contrast  Result Date: 02/28/2017 CLINICAL DATA:  84 year old post unwitnessed fall from bed. Syncope, no other cardiac symptoms EXAM: CT HEAD WITHOUT CONTRAST TECHNIQUE: Contiguous axial images were obtained from the base of the skull through the vertex without intravenous contrast. COMPARISON:  Head CT 01/26/2017 FINDINGS: Brain: Stable degree of atrophy and chronic small vessel ischemia no intracranial hemorrhage, mass effect, or midline shift. No hydrocephalus. The basilar cisterns are patent. No evidence of territorial infarct or acute ischemia. No extra-axial or intracranial fluid collection. Vascular: Atherosclerosis of skullbase vasculature without hyperdense vessel or abnormal calcification. Skull: No fracture or focal lesion. Sinuses/Orbits: Probable bilateral cataract resection. Sclerosis about left side of sphenoid sinus suggests prior paranasal sinus disease, no acute inflammation. Other: None. IMPRESSION: 1.  No acute intracranial abnormality.  No skull fracture. 2.  Unchanged atrophy and chronic small vessel ischemia. Electronically Signed   By: Rubye Oaks M.D.   On: 02/28/2017 04:34   Dg Hip Unilat W Or Wo Pelvis 2-3 Views Right  Result Date: 02/28/2017 CLINICAL DATA:  84 year old with right hip pain after fall. EXAM: DG HIP (WITH OR WITHOUT PELVIS) 2-3V RIGHT COMPARISON:  None. FINDINGS: Intertrochanteric fracture is displaced with displacement involving a fragment containing the lesser trochanter spanning 18 mm. Minimal proximal migration of the femoral shaft. Femoral head remains seated. No additional fracture of the bony pelvis. The pubic rami are intact. Bones are under mineralized. There are vascular calcifications. IMPRESSION: Displaced intertrochanteric right hip fracture. Electronically Signed   By: Rubye Oaks M.D.   On: 02/28/2017 04:27      Procedures   ____________________________________________   INITIAL IMPRESSION / ASSESSMENT AND PLAN / ED COURSE  As part of my medical decision making, I reviewed the following data within the electronic MEDICAL RECORD NUMBER 84 year old female presents with history and physical exam concerning for right hip fracture which was confirmed on xray. Patient discussed with Dr. Hyacinth Meeker  and Dr Tobi Bastos for hospital admission ____________________________________________  FINAL CLINICAL IMPRESSION(S) / ED DIAGNOSES  Final diagnoses:  Closed intertrochanteric fracture of hip, right, initial encounter (HCC)     MEDICATIONS GIVEN DURING THIS VISIT:  Medications  0.9 %  sodium chloride infusion (not administered)  chlorhexidine (HIBICLENS) 4 % liquid (not administered)  busPIRone (BUSPAR) tablet 5 mg (not administered)  feeding supplement (ENSURE ENLIVE) (ENSURE ENLIVE) liquid 237 mL (not administered)  fluticasone (FLONASE) 50 MCG/ACT nasal spray 1 spray (not administered)  Melatonin TABS 3 mg (not administered)  sertraline (ZOLOFT) tablet 100 mg (not administered)  dextrose 5 %-0.9 % sodium chloride  infusion (not administered)  acetaminophen (TYLENOL) tablet 650 mg (not administered)    Or  acetaminophen (TYLENOL) suppository 650 mg (not administered)  HYDROcodone-acetaminophen (NORCO/VICODIN) 5-325 MG per tablet 1-2 tablet (not administered)  ondansetron (ZOFRAN) tablet 4 mg (not administered)    Or  ondansetron (ZOFRAN) injection 4 mg (not administered)  senna-docusate (Senokot-S) tablet 1 tablet (not administered)  morphine 2 MG/ML injection 1 mg (not administered)  hydrALAZINE (APRESOLINE) injection 10 mg (not administered)     ED Discharge Orders    None       Note:  This document was prepared using Dragon voice recognition software and may include unintentional dictation errors.    Darci Current, MD 02/28/17 [phone]

## 2017-02-28 NOTE — Progress Notes (Signed)
Visit made. Patient is currently followed by [facility] at [facility] with a hospice diagnosis of severe calorie malnutrition and dementia. She is a DNR code, with out of facility DNR in place; [facility] following an unwitnessed fall. In the ED she was found to have a displaced intertrochanteric right hip fracture. Per discussion with staff RN Shanda BumpsJessica, plan is for surgical repair today.  Patient seen lying in bed, traction in place to right leg. Patient awakened to voice, unable to answer questions, appeared confused. She also became agitated with staff aide Eber Jonesarolyn when she attempted to perform presurgical wipe down. Patient calmed with interaction from staff RN Shanda BumpsJessica. Patient resting at end of visit. Transfer summary place in patient's chart. Will continue to follow and update hospice team. Dayna BarkerKaren Robertson RN, BSN, [facility],  hospital Liaison [phone]

## 2017-03-01 ENCOUNTER — Inpatient Hospital Stay

## 2017-03-01 ENCOUNTER — Encounter
Admission: EM | Disposition: A | Discharge status: Nursing Home (Includes ICF) | Payer: Self-pay | Source: Home / Self Care | Attending: Internal Medicine

## 2017-03-01 ENCOUNTER — Inpatient Hospital Stay: Admitting: Anesthesiology

## 2017-03-01 HISTORY — PX: INTRAMEDULLARY (IM) NAIL INTERTROCHANTERIC: SHX5875

## 2017-03-01 LAB — BASIC METABOLIC PANEL
Anion gap: 8 (ref 5–15)
BUN: 20 mg/dL (ref 6–20)
CHLORIDE: 107 mmol/L (ref 101–111)
CO2: 27 mmol/L (ref 22–32)
Calcium: 8.4 mg/dL — ABNORMAL LOW (ref 8.9–10.3)
Creatinine, Ser: 0.66 mg/dL (ref 0.44–1.00)
GFR calc Af Amer: >60 mL/min (ref >60)
Glucose, Bld: 153 mg/dL — ABNORMAL HIGH (ref 65–99)
POTASSIUM: 3.7 mmol/L (ref 3.5–5.1)
SODIUM: 142 mmol/L (ref 135–145)

## 2017-03-01 LAB — CBC
HCT: 27.7 % — ABNORMAL LOW (ref 35.0–47.0)
Hemoglobin: 8.9 g/dL — ABNORMAL LOW (ref 12.0–16.0)
MCH: 27.6 pg (ref 26.0–34.0)
MCHC: 32.2 g/dL (ref 32.0–36.0)
MCV: 85.6 fL (ref 80.0–100.0)
PLATELETS: 245 10*3/uL (ref 150–440)
RBC: 3.24 MIL/uL — AB (ref 3.80–5.20)
RDW: 15.1 % — AB (ref 11.5–14.5)
WBC: 9.7 10*3/uL (ref 3.6–11.0)

## 2017-03-01 SURGERY — FIXATION, FRACTURE, INTERTROCHANTERIC, WITH INTRAMEDULLARY ROD
Anesthesia: General | Laterality: Right

## 2017-03-01 MED ORDER — MAGNESIUM HYDROXIDE 400 MG/5ML PO SUSP: 30.0000 mL | Freq: Every day | ORAL | Status: DC | PRN

## 2017-03-01 MED ORDER — FERROUS SULFATE 325 (65 FE) MG PO TABS: 325.0000 mg | ORAL_TABLET | Freq: Every day | ORAL | Status: DC

## 2017-03-01 MED ORDER — SUCCINYLCHOLINE CHLORIDE 20 MG/ML IJ SOLN
INTRAMUSCULAR | Status: DC | PRN
  Administered 2017-03-01: 100 mg via INTRAVENOUS

## 2017-03-01 MED ORDER — ALUM & MAG HYDROXIDE-SIMETH 200-200-20 MG/5ML PO SUSP: 30.0000 mL | ORAL | Status: DC | PRN

## 2017-03-01 MED ORDER — ACETAMINOPHEN 650 MG RE SUPP: 650.0000 mg | Freq: Four times a day (QID) | RECTAL | Status: DC | PRN

## 2017-03-01 MED ORDER — ACETAMINOPHEN 10 MG/ML IV SOLN
INTRAVENOUS | Status: DC | PRN
  Administered 2017-03-01: 500 mg via INTRAVENOUS

## 2017-03-01 MED ORDER — DEXAMETHASONE SODIUM PHOSPHATE 10 MG/ML IJ SOLN
INTRAMUSCULAR | Status: DC | PRN
  Administered 2017-03-01: 5 mg via INTRAVENOUS

## 2017-03-01 MED ORDER — ONDANSETRON HCL 4 MG/2ML IJ SOLN: 4.0000 mg | Freq: Once | INTRAMUSCULAR | Status: DC | PRN

## 2017-03-01 MED ORDER — FENTANYL CITRATE (PF) 100 MCG/2ML IJ SOLN: 25.0000 ug | INTRAMUSCULAR | Status: DC | PRN

## 2017-03-01 MED ORDER — ROCURONIUM BROMIDE 100 MG/10ML IV SOLN
INTRAVENOUS | Status: DC | PRN
  Administered 2017-03-01: 10 mg via INTRAVENOUS
  Administered 2017-03-01 (×2): 5 mg via INTRAVENOUS

## 2017-03-01 MED ORDER — ONDANSETRON HCL 4 MG/2ML IJ SOLN: 4.0000 mg | Freq: Four times a day (QID) | INTRAMUSCULAR | Status: DC | PRN

## 2017-03-01 MED ORDER — CLINDAMYCIN PHOSPHATE 600 MG/50ML IV SOLN
600.0000 mg | Freq: Three times a day (TID) | INTRAVENOUS | Status: AC
  Administered 2017-03-01 – 2017-03-02 (×3): 600 mg via INTRAVENOUS
  Filled 2017-03-01 (×3): qty 50

## 2017-03-01 MED ORDER — LACTATED RINGERS IV SOLN
INTRAVENOUS | Status: DC
  Administered 2017-03-01: 11:00:00 via INTRAVENOUS

## 2017-03-01 MED ORDER — MORPHINE SULFATE (PF) 2 MG/ML IV SOLN: 1.0000 mg | INTRAVENOUS | Status: DC | PRN

## 2017-03-01 MED ORDER — BUPIVACAINE-EPINEPHRINE (PF) 0.25% -1:200000 IJ SOLN
INTRAMUSCULAR | Status: DC | PRN
  Administered 2017-03-01: 30 mL via PERINEURAL

## 2017-03-01 MED ORDER — METOCLOPRAMIDE HCL 5 MG/ML IJ SOLN: 5.0000 mg | Freq: Three times a day (TID) | INTRAMUSCULAR | Status: DC | PRN

## 2017-03-01 MED ORDER — ZOLPIDEM TARTRATE 5 MG PO TABS: 5.0000 mg | ORAL_TABLET | Freq: Every evening | ORAL | Status: DC | PRN

## 2017-03-01 MED ORDER — PHENYLEPHRINE HCL 10 MG/ML IJ SOLN
INTRAMUSCULAR | Status: DC | PRN
  Administered 2017-03-01 (×2): 50 ug via INTRAVENOUS

## 2017-03-01 MED ORDER — ENOXAPARIN SODIUM 30 MG/0.3ML ~~LOC~~ SOLN
30.0000 mg | SUBCUTANEOUS | Status: DC
  Administered 2017-03-02: 30 mg via SUBCUTANEOUS
  Filled 2017-03-01: qty 0.3

## 2017-03-01 MED ORDER — CEFAZOLIN SODIUM-DEXTROSE 1-4 GM/50ML-% IV SOLN
1.0000 g | INTRAVENOUS | Status: AC
  Administered 2017-03-01: 1 g via INTRAVENOUS

## 2017-03-01 MED ORDER — PHENOL 1.4 % MT LIQD
1.0000 | OROMUCOSAL | Status: DC | PRN
  Filled 2017-03-01: qty 177

## 2017-03-01 MED ORDER — ENSURE ENLIVE PO LIQD: 237.0000 mL | Freq: Three times a day (TID) | ORAL | Status: DC

## 2017-03-01 MED ORDER — LIDOCAINE HCL (CARDIAC) 20 MG/ML IV SOLN
INTRAVENOUS | Status: DC | PRN
  Administered 2017-03-01: 60 mg via INTRAVENOUS

## 2017-03-01 MED ORDER — FENTANYL CITRATE (PF) 100 MCG/2ML IJ SOLN
INTRAMUSCULAR | Status: DC | PRN
  Administered 2017-03-01: 25 ug via INTRAVENOUS
  Administered 2017-03-01: 12.5 ug via INTRAVENOUS

## 2017-03-01 MED ORDER — BISACODYL 10 MG RE SUPP: 10.0000 mg | Freq: Every day | RECTAL | Status: DC | PRN

## 2017-03-01 MED ORDER — MENTHOL 3 MG MT LOZG
1.0000 | LOZENGE | OROMUCOSAL | Status: DC | PRN
  Filled 2017-03-01: qty 9

## 2017-03-01 MED ORDER — CEFAZOLIN SODIUM-DEXTROSE 1-4 GM/50ML-% IV SOLN
INTRAVENOUS | Status: AC
  Filled 2017-03-01: qty 50

## 2017-03-01 MED ORDER — SODIUM CHLORIDE 0.45 % IV SOLN
INTRAVENOUS | Status: DC
  Administered 2017-03-01 – 2017-03-03 (×3): via INTRAVENOUS

## 2017-03-01 MED ORDER — NEOMYCIN-POLYMYXIN B GU 40-200000 IR SOLN
Status: DC | PRN
  Administered 2017-03-01: 2 mL

## 2017-03-01 MED ORDER — DEXTROSE 5 % IV SOLN
1.0000 g | Freq: Four times a day (QID) | INTRAVENOUS | Status: AC
  Administered 2017-03-01 – 2017-03-02 (×3): 1 g via INTRAVENOUS
  Filled 2017-03-01 (×3): qty 10

## 2017-03-01 MED ORDER — ONDANSETRON HCL 4 MG/2ML IJ SOLN
INTRAMUSCULAR | Status: DC | PRN
  Administered 2017-03-01: 4 mg via INTRAVENOUS

## 2017-03-01 MED ORDER — PROPOFOL 10 MG/ML IV BOLUS
INTRAVENOUS | Status: DC | PRN
  Administered 2017-03-01: 50 mg via INTRAVENOUS

## 2017-03-01 MED ORDER — METOCLOPRAMIDE HCL 10 MG PO TABS: 5.0000 mg | ORAL_TABLET | Freq: Three times a day (TID) | ORAL | Status: DC | PRN

## 2017-03-01 MED ORDER — ACETAMINOPHEN 325 MG PO TABS: 650.0000 mg | ORAL_TABLET | Freq: Four times a day (QID) | ORAL | Status: DC | PRN

## 2017-03-01 MED ORDER — SENNA 8.6 MG PO TABS
1.0000 | ORAL_TABLET | Freq: Two times a day (BID) | ORAL | Status: DC
  Administered 2017-03-02: 8.6 mg via ORAL
  Filled 2017-03-01: qty 1

## 2017-03-01 MED ORDER — SUGAMMADEX SODIUM 200 MG/2ML IV SOLN
INTRAVENOUS | Status: DC | PRN
  Administered 2017-03-01: 90 mg via INTRAVENOUS

## 2017-03-01 MED ORDER — CLINDAMYCIN PHOSPHATE 600 MG/50ML IV SOLN
600.0000 mg | INTRAVENOUS | Status: AC
  Administered 2017-03-01: 600 mg via INTRAVENOUS

## 2017-03-01 MED ORDER — FLEET ENEMA 7-19 GM/118ML RE ENEM: 1.0000 | ENEMA | Freq: Once | RECTAL | Status: DC | PRN

## 2017-03-01 MED ORDER — HYDROCODONE-ACETAMINOPHEN 5-325 MG PO TABS: 1.0000 | ORAL_TABLET | Freq: Four times a day (QID) | ORAL | Status: DC | PRN

## 2017-03-01 MED ORDER — ONDANSETRON HCL 4 MG PO TABS: 4.0000 mg | ORAL_TABLET | Freq: Four times a day (QID) | ORAL | Status: DC | PRN

## 2017-03-01 MED ORDER — CEFAZOLIN SODIUM-DEXTROSE 1-4 GM/50ML-% IV SOLN
1.0000 g | Freq: Three times a day (TID) | INTRAVENOUS | Status: DC
  Filled 2017-03-01: qty 50

## 2017-03-01 MED ORDER — CLINDAMYCIN PHOSPHATE 600 MG/50ML IV SOLN
INTRAVENOUS | Status: AC
  Filled 2017-03-01: qty 50

## 2017-03-01 SURGICAL SUPPLY — 39 items
BIT DRILL SPINE 4.0MMX260 (BIT) ×1
BIT DRILL SPINE 4.0X260 (BIT) ×2 IMPLANT
BLADE HELICAL 90 (Orthopedic Implant) ×2 IMPLANT
BLADE HELICAL 90MM (Orthopedic Implant) ×1 IMPLANT
BNDG COHESIVE 4X5 TAN STRL (GAUZE/BANDAGES/DRESSINGS) ×3 IMPLANT
CANISTER SUCT 1200ML W/VALVE (MISCELLANEOUS) ×3 IMPLANT
CHLORAPREP W/TINT 26ML (MISCELLANEOUS) ×6 IMPLANT
DRAPE C-ARMOR (DRAPES) IMPLANT
DRAPE INCISE 23X17 IOBAN STRL (DRAPES) ×2
DRAPE INCISE IOBAN 23X17 STRL (DRAPES) ×1 IMPLANT
DRSG AQUACEL AG ADV 3.5X10 (GAUZE/BANDAGES/DRESSINGS) ×3 IMPLANT
DRSG AQUACEL AG ADV 3.5X14 (GAUZE/BANDAGES/DRESSINGS) IMPLANT
ELECT REM PT RETURN 9FT ADLT (ELECTROSURGICAL) ×3
ELECTRODE REM PT RTRN 9FT ADLT (ELECTROSURGICAL) ×1 IMPLANT
GAUZE PETRO XEROFOAM 1X8 (MISCELLANEOUS) IMPLANT
GAUZE SPONGE 4X4 12PLY STRL (GAUZE/BANDAGES/DRESSINGS) ×3 IMPLANT
GLOVE INDICATOR 8.0 STRL GRN (GLOVE) ×3 IMPLANT
GLOVE SURG ORTHO 8.5 STRL (GLOVE) ×3 IMPLANT
GOWN STRL REUS W/ TWL LRG LVL3 (GOWN DISPOSABLE) ×1 IMPLANT
GOWN STRL REUS W/TWL LRG LVL3 (GOWN DISPOSABLE) ×2
GOWN STRL REUS W/TWL LRG LVL4 (GOWN DISPOSABLE) ×3 IMPLANT
GUIDEWIRE 3.2X400 (WIRE) ×3 IMPLANT
HOLDER FOLEY CATH W/STRAP (MISCELLANEOUS) ×3 IMPLANT
KIT RM TURNOVER STRD PROC AR (KITS) ×3 IMPLANT
MAT BLUE FLOOR 46X72 FLO (MISCELLANEOUS) ×3 IMPLANT
NAIL TROCH FX 11/130D 170-S (Nail) ×3 IMPLANT
NEEDLE SPNL 18GX3.5 QUINCKE PK (NEEDLE) ×3 IMPLANT
NS IRRIG 500ML POUR BTL (IV SOLUTION) ×3 IMPLANT
PACK HIP COMPR (MISCELLANEOUS) ×3 IMPLANT
REAMER ROD DEEP FLUTE 2.5X950 (INSTRUMENTS) ×3 IMPLANT
SCREW LOCK TI 5.0X38 F/IM NAIL (Screw) ×3 IMPLANT
STAPLER SKIN PROX 35W (STAPLE) ×3 IMPLANT
SUCTION FRAZIER HANDLE 10FR (MISCELLANEOUS) ×2
SUCTION TUBE FRAZIER 10FR DISP (MISCELLANEOUS) ×1 IMPLANT
SUT VIC AB 0 CT1 36 (SUTURE) ×3 IMPLANT
SUT VIC AB 2-0 CT1 27 (SUTURE) ×2
SUT VIC AB 2-0 CT1 TAPERPNT 27 (SUTURE) ×1 IMPLANT
SYR 30ML LL (SYRINGE) ×3 IMPLANT
TRAY FOLEY MTR SLVR 16FR STAT (CATHETERS) ×3 IMPLANT

## 2017-03-01 NOTE — Progress Notes (Signed)
[employer] [facility] [facility] [place] [facility]   PATIENT NAME: Marie PellegriniMary Vega    MR#:  [id number]  DATE OF BIRTH:  03/26/1919  SUBJECTIVE:   Patient with severe dementia. Family at bedside  REVIEW OF SYSTEMS:    Unable to obtain due to dementia   Tolerating Diet: Nothing by mouth      DRUG ALLERGIES:  No Known Allergies  VITALS:  Blood pressure (!) 130/53, pulse 76, temperature 98 F (36.7 C), temperature source Oral, resp. rate 16, height 5\' 7"  (1.702 m), weight 44.9 kg (99 lb), SpO2 97 %.  PHYSICAL EXAMINATION:  Constitutional: Appears well-developed and well-nourished. No distress. HENT: Normocephalic. Marland Kitchen. Oropharynx is clear and moist.  Eyes: Conjunctivae and EOM are normal. PERRLA, no scleral icterus.  Neck: Normal ROM. Neck supple. No JVD. No tracheal deviation. CVS: RRR, S1/S2 +, 2/6 SEM no gallops, no carotid bruit.  Pulmonary: Decreased breath sounds bilaterally without rhonchi or rails  Abdominal: Soft. BS +,  no distension, tenderness, rebound or guarding.  Musculoskeletal:right leg shorter   Neuro: Alert. CN 2-12 grossly intact. No focal deficits. Skin: Skin is warm and dry. No rash noted. Psychiatric:dementia    LABORATORY PANEL:   CBC Recent Labs  Lab 02/28/17 0647  WBC 13.0*  HGB 10.0*  HCT 31.1*  PLT 275   ------------------------------------------------------------------------------------------------------------------  Chemistries  Recent Labs  Lab 03/01/17 0510  NA 142  K 3.7  CL 107  CO2 27  GLUCOSE 153*  BUN 20  CREATININE 0.66  CALCIUM 8.4*   ------------------------------------------------------------------------------------------------------------------  Cardiac Enzymes Recent Labs  Lab 02/28/17 0320  TROPONINI <0.03   ------------------------------------------------------------------------------------------------------------------  RADIOLOGY:  Ct Head Wo Contrast  Result Date: 02/28/2017 CLINICAL DATA:   84 year old post unwitnessed fall from bed. Syncope, no other cardiac symptoms EXAM: CT HEAD WITHOUT CONTRAST TECHNIQUE: Contiguous axial images were obtained from the base of the skull through the vertex without intravenous contrast. COMPARISON:  Head CT 01/26/2017 FINDINGS: Brain: Stable degree of atrophy and chronic small vessel ischemia no intracranial hemorrhage, mass effect, or midline shift. No hydrocephalus. The basilar cisterns are patent. No evidence of territorial infarct or acute ischemia. No extra-axial or intracranial fluid collection. Vascular: Atherosclerosis of skullbase vasculature without hyperdense vessel or abnormal calcification. Skull: No fracture or focal lesion. Sinuses/Orbits: Probable bilateral cataract resection. Sclerosis about left side of sphenoid sinus suggests prior paranasal sinus disease, no acute inflammation. Other: None. IMPRESSION: 1.  No acute intracranial abnormality.  No skull fracture. 2. Unchanged atrophy and chronic small vessel ischemia. Electronically Signed   By: Rubye OaksMelanie  Ehinger M.D.   On: 02/28/2017 04:34   Dg Hip Unilat W Or Wo Pelvis 2-3 Views Right  Result Date: 02/28/2017 CLINICAL DATA:  84 year old with right hip pain after fall. EXAM: DG HIP (WITH OR WITHOUT PELVIS) 2-3V RIGHT COMPARISON:  None. FINDINGS: Intertrochanteric fracture is displaced with displacement involving a fragment containing the lesser trochanter spanning 18 mm. Minimal proximal migration of the femoral shaft. Femoral head remains seated. No additional fracture of the bony pelvis. The pubic rami are intact. Bones are under mineralized. There are vascular calcifications. IMPRESSION: Displaced intertrochanteric right hip fracture. Electronically Signed   By: Rubye OaksMelanie  Ehinger M.D.   On: 02/28/2017 04:27     ASSESSMENT AND PLAN:   84 year old female with severe dementia who presented after a fall and suffered a right intertrochanteric fracture.  1. Right intertrochanteric fracture:  Patient is planned for surgery today DVT prophylaxis as per orthopedic surgery Continue when necessary pain medications  2. Hypokalemia: Potassium has normalized  3. Dementia/depression: Continue BuSpar and Zoloft      Management plans discussed with the patient's family and they are in agreement.  CODE STATUS: DO NOT RESUSCITATE  TOTAL TIME TAKING CARE OF THIS PATIENT: 29 minutes.     POSSIBLE D/C 2 days, DEPENDING ON CLINICAL CONDITION.   Anberlin Diez M.D on 03/01/2017 at 9:28 AM  Between 7am to 6pm - Pager - [phone] After 6pm go to [email or web address] - password [employer]  [facility]  Office  [phone]  CC: Primary care physician; Randie Heinz, NP  Note: This dictation was prepared with Dragon dictation along with smaller phrase technology. Any transcriptional errors that result from this process are unintentional.

## 2017-03-01 NOTE — Transfer of Care (Signed)
Immediate Anesthesia Transfer of Care Note  Patient: Marie Vega  Procedure(s) Performed: INTRAMEDULLARY (IM) NAIL INTERTROCHANTRIC (Right )  Patient Location: PACU  Anesthesia Type:General  Level of Consciousness: sedated  Airway & Oxygen Therapy: Patient Spontanous Breathing and Patient connected to face mask oxygen  Post-op Assessment: Report given to RN and Post -op Vital signs reviewed and stable  Post vital signs: Reviewed and stable  Last Vitals:  Vitals:   03/01/17 0732 03/01/17 1031  BP: (!) 130/53 (!) 131/42  Pulse: 76 78  Resp: 16 17  Temp: 36.7 C 36.7 C  SpO2: 97% 95%    Last Pain:  Vitals:   03/01/17 1031  TempSrc: Tympanic  PainSc: Asleep      Patients Stated Pain Goal: 1 (02/28/17 1546)  Complications: No apparent anesthesia complications

## 2017-03-01 NOTE — Anesthesia Procedure Notes (Signed)
Procedure Name: Intubation Date/Time: 03/01/2017 11:31 AM Performed by: Dionne Bucy, CRNA Pre-anesthesia Checklist: Patient identified, Patient being monitored, Timeout performed, Emergency Drugs available and Suction available Patient Re-evaluated:Patient Re-evaluated prior to induction Oxygen Delivery Method: Circle system utilized Preoxygenation: Pre-oxygenation with 100% oxygen Induction Type: IV induction Ventilation: Mask ventilation without difficulty Laryngoscope Size: Mac and 3 Grade View: Grade I Tube type: Oral Tube size: 7.0 mm Number of attempts: 1 Airway Equipment and Method: Stylet Placement Confirmation: ETT inserted through vocal cords under direct vision,  positive ETCO2 and breath sounds checked- equal and bilateral Secured at: 22 cm Tube secured with: Tape Dental Injury: Teeth and Oropharynx as per pre-operative assessment

## 2017-03-01 NOTE — Anesthesia Post-op Follow-up Note (Signed)
Anesthesia QCDR form completed.        

## 2017-03-01 NOTE — Progress Notes (Signed)
Visit made. Patient seen just beck from surgery for repair of a right hip fracture. Daughter in law BonneyFunda at bedside. Patient remained sound asleep, snoring softly during visit. Staff RN Shanda BumpsJessica present in the room. Patient appeared comfortable. Questions regarding pain management answered by both writer and staff RN's. Much reassurance and education given to [facility] regarding recovery time from anesthesia. She voiced her concern regarding patient not having eaten. Again education provided regarding need for patient to be fully awake and alert prior to taking anything orally. IV fluids in place and running as per MD order. Will continue to follow and update hospice team. Dayna BarkerKaren Robertson RN, BSN, [facility], hospital Liaison [phone]

## 2017-03-01 NOTE — Op Note (Signed)
DATE OF SURGERY:  03/01/2017  TIME: 12:53 PM  PATIENT NAME:  Marie Vega  AGE: 84 y.o.  PRE-OPERATIVE DIAGNOSIS:    displaced intertrochanteric fracture right hip  POST-OPERATIVE DIAGNOSIS:  SAME  PROCEDURE:  INTRAMEDULLARY (IM) NAIL INTERTROCHANTRIC RIGHT HIP  SURGEON:  Shemekia Patane E  EBL:  50 cc  COMPLICATIONS:  NONE  OPERATIVE IMPLANTS: Synthes trochanteric femoral nail  11MM/130*  with interlocking helical blade  90 mm and distal locking screw  38 mm.  PREOPERATIVE INDICATIONS:  Marie Vega is a 84 y.o. year old who fell and suffered a hip fracture. She was brought into the ER and then admitted and optimized and then elected for surgical intervention.    The risks benefits and alternatives were discussed with the patient including but not limited to the risks of nonoperative treatment, versus surgical intervention including infection, bleeding, nerve injury, malunion, nonunion, hardware prominence, hardware failure, need for hardware removal, blood clots, cardiopulmonary complications, morbidity, mortality, among others, and they were willing to proceed.    OPERATIVE PROCEDURE:  The patient was brought to the operating room and placed in the supine position.  GENERAL ET anesthesia was administered, with a foley. She was placed on the fracture table.  Closed reduction was performed under C-arm guidance. The length of the femur was also measured using fluoroscopy. Time out was then performed after sterile prep and drape. She received preoperative antibiotics.  Incision was made proximal to the greater trochanter. A guidewire was placed in the appropriate position. Confirmation was made on AP and lateral views. The above-named nail was opened. I opened the proximal femur with a reamer. I then placed the nail by hand easily down. I did not need to ream the femur.  Once the nail was completely seated, I placed a guidepin into the femoral head into the center center position through a  second incision.  I measured the length, and then reamed the lateral cortex and up into the head. I then placed the helical blade. Slight compression was applied. Anatomic fixation achieved. Bone quality was mediocre.  I then secured the proximal interlock.  The distal locking screw was then placed and after confirming the position of the fracture fragments and hardware I then removed the instruments, and took final C-arm pictures AP and lateral the entire length of the leg. Anatomic reconstruction was achieved, and the wounds were irrigated copiously and closed with Vicryl  followed by staples and dry sterile dressing. Sponge and needle count were correct.   The patient was awakened and returned to PACU in stable and satisfactory condition. There no complications and the patient tolerated the procedure well.  She will be partial weightbearing as tolerated, and will be on Lovenox  For DVT prophylaxis.     Valinda HoarHoward E Paige Vanderwoude, M.D.

## 2017-03-01 NOTE — Anesthesia Preprocedure Evaluation (Addendum)
Anesthesia Evaluation  Patient identified by MRN, date of birth, ID band Patient awake    Reviewed: Allergy & Precautions, NPO status , Patient's Chart, lab work & pertinent test results  History of Anesthesia Complications Negative for: history of anesthetic complications  Airway   TM Distance: >3 FB    Comment: Limited assessment, pt noncooperative  Dental  (+) Poor Dentition   Pulmonary neg pulmonary ROS, neg sleep apnea, neg COPD,    breath sounds clear to auscultation- rhonchi (-) wheezing      Cardiovascular Exercise Tolerance: Poor (-) hypertension(-) CAD, (-) Past MI and (-) Cardiac Stents  Rhythm:Regular Rate:Normal - Systolic murmurs and - Diastolic murmurs    Neuro/Psych Depression Severe dementia     GI/Hepatic negative GI ROS, Neg liver ROS,   Endo/Other  negative endocrine ROSneg diabetes  Renal/GU negative Renal ROS     Musculoskeletal negative musculoskeletal ROS (+)   Abdominal (+) - obese,   Peds  Hematology negative hematology ROS (+)   Anesthesia Other Findings Past Medical History: No date: Dementia No date: Depression No date: Syphilis   Reproductive/Obstetrics                             Anesthesia Physical Anesthesia Plan  ASA: II  Anesthesia Plan: General   Post-op Pain Management:    Induction: Intravenous  PONV Risk Score and Plan: 2 and Ondansetron and Dexamethasone  Airway Management Planned: Oral ETT  Additional Equipment:   Intra-op Plan: Utilization of Controlled Hypotension per surrgeon request  Post-operative Plan: Extubation in OR  Informed Consent: I have reviewed the patients History and Physical, chart, labs and discussed the procedure including the risks, benefits and alternatives for the proposed anesthesia with the patient or authorized representative who has indicated his/her understanding and acceptance.   Dental advisory  given and Consent reviewed with POA  Plan Discussed with: CRNA and Anesthesiologist  Anesthesia Plan Comments: (Pt has DNR order, we discussed plan for suspension of DNR order during periop period due to possible need for respiratory support, BP support and potential reactions to medications during the procedure. )       Anesthesia Quick Evaluation

## 2017-03-01 NOTE — Progress Notes (Signed)
Pt transferred to  OR.

## 2017-03-01 NOTE — Plan of Care (Signed)
  Education: Knowledge of General Education information will improve 03/01/2017 0441 - Not Progressing by Erman Thum, Genelle GatherMatthew Scott, RN

## 2017-03-01 NOTE — Progress Notes (Addendum)
Clinical Child psychotherapistocial Worker (CSW) contacted [facility] director Cindy Boone and discussed case. Per Arline Aspindy patient has a hospital bed and [facility] and [facility]. [facility] lia[facility] is aware of above. Patient's daughter in law Buck MamFunda is aware of above.   Baker Hughes IncorporatedBailey Ronette Hank, LCSW [phone]

## 2017-03-01 NOTE — Anesthesia Postprocedure Evaluation (Signed)
Anesthesia Post Note  Patient: Marie PellegriniMary Vega  Procedure(s) Performed: INTRAMEDULLARY (IM) NAIL INTERTROCHANTRIC (Right )  Patient location during evaluation: PACU Anesthesia Type: General Level of consciousness: awake and alert Pain management: pain level controlled Vital Signs Assessment: post-procedure vital signs reviewed and stable Respiratory status: spontaneous breathing, nonlabored ventilation and respiratory function stable Cardiovascular status: blood pressure returned to baseline and stable Postop Assessment: no signs of nausea or vomiting Anesthetic complications: no     Last Vitals:  Vitals:   03/01/17 1356 03/01/17 1427  BP: (!) 124/44 (!) 114/32  Pulse: 78 80  Resp: 14 16  Temp:  36.6 C  SpO2: 93% 93%    Last Pain:  Vitals:   03/01/17 1427  TempSrc: Oral  PainSc:                  Roan Sawchuk

## 2017-03-01 NOTE — Progress Notes (Signed)
Pt back to unit from PACU. Skin assessment completed with Darien RamusAna, RN. IV infusing. Pt on room air. Pt's foley intact. Pt drowsy. Family at bedside.

## 2017-03-01 NOTE — Progress Notes (Signed)
Initial Nutrition Assessment  DOCUMENTATION CODES:   Severe malnutrition in context of chronic illness  INTERVENTION:   Ensure Enlive po TID, each supplement provides 350 kcal and 20 grams of protein  Magic cup TID with meals, each supplement provides 290 kcal and 9 grams of protein  Dysphagia 3 diet   Assist with meals  NUTRITION DIAGNOSIS:   Severe Malnutrition related to chronic illness(dementia, advanced age ) as evidenced by 10 percent weight loss in 1 month, severe fat depletion, severe muscle depletion.  GOAL:   Patient will meet greater than or equal to 90% of their needs  MONITOR:   PO intake, Supplement acceptance, Labs, Weight trends, I & O's, Skin  REASON FOR ASSESSMENT:   Other (Comment)(low BMI)    ASSESSMENT:   84 year old female with severe dementia followed by hospice who presented after a fall and suffered a right intertrochanteric fracture. Now s/p fracture repair 1/10   Visited pt's room today. Unable to speak with pt r/t severe dementia. No family at bedside. Per hospice RN, pt is a fairly good eater at baseline; pt eating 50-75% of her meals pta. Per chart, pt has lost 11lbs(10%) in one month; this is severe weight loss. Pt likely unable to meet her estimated needs r/t dementia. RD will downgrade pt's diet to a dysphagia 3 to minimize effort needed from chewing. Per RN, pt drank and Ensure yesterday; RD will order Ensure and Magic Cups.     Medications reviewed and include: lovenox, ferrous sulfate, senokot, NaCl @ 9250ml/hr, cefazolin, clindamycin  Labs reviewed: Ca 8.4(L) Hgb 8.9(L), Hct 27.7(L)  Nutrition-Focused physical exam completed. Findings are severe fat and muscle depletions over entire body, and no edema. Pt noted to have bruising on bilateral arms.   Diet Order:  DIET DYS 3 Room service appropriate? No; Fluid consistency: Thin  EDUCATION NEEDS:   Not appropriate for education at this time  Skin:  Skin Assessment: (incision R  hip)  Last BM:  1/8  Height:   Ht Readings from Last 1 Encounters:  02/28/17 5\' 7"  (1.702 m)    Weight:   Wt Readings from Last 1 Encounters:  02/28/17 99 lb (44.9 kg)    Ideal Body Weight:  61.3 kg  BMI:  Body mass index is 15.51 kg/m.  Estimated Nutritional Needs:   Kcal:  1200-1400kcal/day   Protein:  72-81g/day   Fluid:  >1.3L/day  Betsey Holidayasey Shirleyann Montero MS, RD, LDN Pager #[phone] After Hours Pager: [phone]

## 2017-03-01 NOTE — H&P (Signed)
THE PATIENT WAS SEEN PRIOR TO SURGERY TODAY.  HISTORY, ALLERGIES, HOME MEDICATIONS AND OPERATIVE PROCEDURE WERE REVIEWED. RISKS AND BENEFITS OF SURGERY DISCUSSED WITH PATIENT AGAIN.  NO CHANGES FROM INITIAL HISTORY AND PHYSICAL NOTED.    

## 2017-03-02 ENCOUNTER — Encounter: Payer: Self-pay | Admitting: Specialist

## 2017-03-02 DIAGNOSIS — E43 Unspecified severe protein-calorie malnutrition: Secondary | ICD-10-CM

## 2017-03-02 LAB — BASIC METABOLIC PANEL
Anion gap: 6 (ref 5–15)
BUN: 25 mg/dL — AB (ref 6–20)
CHLORIDE: 104 mmol/L (ref 101–111)
CO2: 29 mmol/L (ref 22–32)
CREATININE: 0.71 mg/dL (ref 0.44–1.00)
Calcium: 8 mg/dL — ABNORMAL LOW (ref 8.9–10.3)
GFR calc Af Amer: >60 mL/min (ref >60)
GFR calc non Af Amer: >60 mL/min (ref >60)
Glucose, Bld: 142 mg/dL — ABNORMAL HIGH (ref 65–99)
Potassium: 4.7 mmol/L (ref 3.5–5.1)
SODIUM: 139 mmol/L (ref 135–145)

## 2017-03-02 LAB — CBC
HCT: 22.1 % — ABNORMAL LOW (ref 35.0–47.0)
Hemoglobin: 7.1 g/dL — ABNORMAL LOW (ref 12.0–16.0)
MCH: 27.4 pg (ref 26.0–34.0)
MCHC: 32.2 g/dL (ref 32.0–36.0)
MCV: 84.9 fL (ref 80.0–100.0)
Platelets: 257 10*3/uL (ref 150–440)
RBC: 2.6 MIL/uL — ABNORMAL LOW (ref 3.80–5.20)
RDW: 14.7 % — ABNORMAL HIGH (ref 11.5–14.5)
WBC: 13.2 10*3/uL — ABNORMAL HIGH (ref 3.6–11.0)

## 2017-03-02 LAB — PREPARE RBC (CROSSMATCH)

## 2017-03-02 LAB — ABO/RH: ABO/RH(D): A NEG

## 2017-03-02 MED ORDER — SODIUM CHLORIDE 0.9 % IV SOLN: Freq: Once | INTRAVENOUS | Status: DC

## 2017-03-02 NOTE — Progress Notes (Signed)
Per MD patient will likely D/C back to [facility] tomorrow. Patient will return via EMS. Clinical Child psychotherapistocial Worker (CSW) contacted [facility] supervisor and made her aware of above. CSW also also left a voicemail for [facility] director making her aware of above. [facility] liaison is aware of above.   [employer] IncorporatedBailey Zephyra Bernardi, LCSW [phone]

## 2017-03-02 NOTE — Progress Notes (Signed)
Visit made patient seen lying in bed, she remains lethargic following her surgery for right hip repair yesterday. Patient did open her eyes several times and responded to a yes/no question. Mouth care provided, breakfast tray at bedside, patient refused any bites or sips even with multiple attempts. Physical Therapist in during visit, patient tolerated some passive range of motion exercises, but did grimace with each movement. She appeared to be resting comfortably at end of visit. No family present. Plan continues for return to [facility] to continue hospice services when ready. Hospice team updated. Dayna BarkerKaren Robertson RN, BSN, [facility], hospital liaison [phone]

## 2017-03-02 NOTE — Progress Notes (Signed)
PT Cancellation Note  Patient Details Name: Marie Vega MRN: [id number] DOB: 03/22/1919   Cancelled Treatment:    Reason Eval/Treat Not Completed: (Family member (daughter in law) noted in room in PM.  Discussion regarding goals of care and wishes for PT involvement.  Per daughter in law, primary goal is for patient to remain comfortable at this time.  Feels patient was in significant pain with rolling during bed change/hygiene earlier, and wishes for therapy to hold off at this time.  Did ask that therapy "check back tomorrow", but does not wish to intervene if it makes patient uncomfortable.  Will continue efforts next date as patient/family desire.  Of note, possible discharge planned for next date.)   Myreon Wimer H. Manson PasseyBrown, PT, DPT, NCS 03/02/17, 2:40 PM [phone]

## 2017-03-02 NOTE — Progress Notes (Signed)
OT Cancellation Note  Patient Details Name: Princella PellegriniMary Trefry MRN: [id number] DOB: 01/08/1920   Cancelled Treatment:    Reason Eval/Treat Not Completed: Medical issues which prohibited therapy;Fatigue/lethargy limiting ability to participate;Pain limiting ability to participate. Pt. Has Dementia, and resides [facility]. [facility]. Pt.'s eyes were open intermittently upon arrival. Pt.'s daughter in law was feeding pt., and reports that the pt.'s intake is limited. Pt. Is unable actively engage in, and participate in OT evaluation at this time. Will continue to monitor, and eval as appropriate.  Olegario MessierElaine Oza Oberle, MS, OTR/L 03/02/2017, 1:52 PM

## 2017-03-02 NOTE — Progress Notes (Signed)
[facility] [place] [facility]   PATIENT NAME: Marie PellegriniMary Vega    MR#:  [id number]  DATE OF BIRTH:  12/30/1919  SUBJECTIVE:  Not eating well  Patient is postoperative day 1  REVIEW OF SYSTEMS:    Unable to obtain due to dementia   Tolerating Diet: Not eating much     DRUG ALLERGIES:  No Known Allergies  VITALS:  Blood pressure (!) 126/46, pulse 76, temperature 97.8 F (36.6 C), temperature source Oral, resp. rate 18, height 5\' 7"  (1.702 m), weight 44.9 kg (99 lb), SpO2 97 %.  PHYSICAL EXAMINATION:  Constitutional: Appears frail. No distress. HENT: Normocephalic. Marland Kitchen. Oropharynx is clear and moist.  Eyes: Conjunctivae and EOM are normal. PERRLA, no scleral icterus.  Neck: Normal ROM. Neck supple. No JVD. No tracheal deviation. CVS: RRR, S1/S2 +, 2/6 SEM no gallops, no carotid bruit.  Pulmonary: Decreased breath sounds bilaterally without rhonchi or rails  Abdominal: Soft. BS +,  no distension, tenderness, rebound or guarding.  Musculoskeletal:right leg shorter   Neuro: Lethargic . No obvious focal deficits. Skin: Skin is warm and dry. No rash noted. Psychiatric:dementia    LABORATORY PANEL:   CBC Recent Labs  Lab 03/02/17 0621  WBC 13.2*  HGB 7.1*  HCT 22.1*  PLT 257   ------------------------------------------------------------------------------------------------------------------  Chemistries  Recent Labs  Lab 03/02/17 0621  NA 139  K 4.7  CL 104  CO2 29  GLUCOSE 142*  BUN 25*  CREATININE 0.71  CALCIUM 8.0*   ------------------------------------------------------------------------------------------------------------------  Cardiac Enzymes Recent Labs  Lab 02/28/17 0320  TROPONINI <0.03   ------------------------------------------------------------------------------------------------------------------  RADIOLOGY:  Dg C-arm 1-60 Min  Result Date: 03/01/2017 CLINICAL DATA:  Right femur ORIF EXAM: DG C-ARM 61-120 MIN; RIGHT  FEMUR 2 VIEWS COMPARISON:  1/9/9 FLUOROSCOPY TIME:  Fluoroscopy Time:  1 minute 9 seconds Radiation Exposure Index (if provided by the fluoroscopic device): Not available Number of Acquired Spot Images: 3 FINDINGS: Medullary rod is noted in the proximal right femur. Fixation screws noted traversing the femoral neck. The fracture fragments are in near anatomic alignment. IMPRESSION: ORIF of proximal right femoral fracture. Electronically Signed   By: Alcide CleverMark  Lukens M.D.   On: 03/01/2017 12:57   Dg Femur, Min 2 Views Right  Result Date: 03/01/2017 CLINICAL DATA:  Right femur ORIF EXAM: DG C-ARM 61-120 MIN; RIGHT FEMUR 2 VIEWS COMPARISON:  1/9/9 FLUOROSCOPY TIME:  Fluoroscopy Time:  1 minute 9 seconds Radiation Exposure Index (if provided by the fluoroscopic device): Not available Number of Acquired Spot Images: 3 FINDINGS: Medullary rod is noted in the proximal right femur. Fixation screws noted traversing the femoral neck. The fracture fragments are in near anatomic alignment. IMPRESSION: ORIF of proximal right femoral fracture. Electronically Signed   By: Alcide CleverMark  Lukens M.D.   On: 03/01/2017 12:57     ASSESSMENT AND PLAN:   84 year old female with severe dementia who presented after a fall and suffered a right intertrochanteric fracture.  1. Right intertrochanteric fracture: Patient is postoperative day #1 intramedullary nailing DVT prophylaxis as per orthopedic surgery Continue when necessary pain medications Hemoglobin dropped to 7.1 this morning Family prefers more of a comfort measures since she is on hospice   2. Hypokalemia: Potassium has normalized  3. Dementia/depression: Continue BuSpar and Zoloft She is on hospice at facility Encouraged to eat when awake      Management plans discussed with the patient's family and they are in agreement.  CODE STATUS: DO NOT RESUSCITATE  TOTAL TIME TAKING  CARE OF THIS PATIENT: 24 minutes.     POSSIBLE D/C  tomorrow to [facility] care DEPENDING ON CLINICAL CONDITION.   Jonathin Heinicke M.D on 03/02/2017 at 11:39 AM  Between 7am to 6pm - Pager - [phone] After 6pm go to [email or web address] - password [email or web address]  [facility]  Office  [phone]  CC: Primary care physician; Randie Heinz, NP  Note: This dictation was prepared with Dragon dictation along with smaller phrase technology. Any transcriptional errors that result from this process are unintentional.

## 2017-03-02 NOTE — Progress Notes (Signed)
Patient lethargic today but opens eyes at times and spoke a few times to this nurse. Patient did eat a small amount for her daughter in law while she visited at lunch. Patient received a unit of blood today. Dr. Hyacinth MeekerMiller told nurse did not need a H&H.

## 2017-03-02 NOTE — Progress Notes (Signed)
Subjective:   The patient is awake and fairly alert.  She is somewhat verbal.  Nursing staff says she is refusing to take p.o. Well.  She is lying with the legs flexed in a pillow between the knees.   1 Day Post-Op Procedure(s) (LRB): INTRAMEDULLARY (IM) NAIL INTERTROCHANTRIC (Right)    Patient reports pain as mild.  Objective:   VITALS:   Vitals:   03/02/17 0442 03/02/17 0749  BP: (!) 115/37 (!) 126/46  Pulse: 74 76  Resp: 17 18  Temp: 97.8 F (36.6 C) 97.8 F (36.6 C)  SpO2: 95% 97%    Neurologically intact ABD soft Neurovascular intact Sensation intact distally Intact pulses distally Dorsiflexion/Plantar flexion intact Incision: scant drainage  LABS Recent Labs    02/28/17 0647 03/01/17 0513 03/02/17 0621  HGB 10.0* 8.9* 7.1*  HCT 31.1* 27.7* 22.1*  WBC 13.0* 9.7 13.2*  PLT 275 245 257    Recent Labs    02/28/17 1106 03/01/17 0510 03/02/17 0621  NA 136 142 139  K 4.5 3.7 4.7  BUN 15 20 25*  CREATININE 1.02* 0.66 0.71  GLUCOSE 111* 153* 142*    Recent Labs    02/28/17 0647  INR 1.14     Assessment/Plan: 1 Day Post-Op Procedure(s) (LRB): INTRAMEDULLARY (IM) NAIL INTERTROCHANTRIC (Right)   Advance diet Up with therapy Discharge to SNF   Continue IV fluids as she is not taking p.o. Well.  Enteric-coated aspirin 1 p.o. twice daily 325 mg for 6 weeks following discharge  Return to office in 2 weeks for staple removal

## 2017-03-02 NOTE — Progress Notes (Signed)
PT Cancellation Note  Patient Details Name: Princella PellegriniMary Englander MRN: [id number] DOB: 03/12/1919   Cancelled Treatment:    Reason Eval/Treat Not Completed: Fatigue/lethargy limiting ability to participate(Consult received and chart reviewed.  Entered room for patient assessment.  Patient resting comfortably with eyes closed; does briefly open eyes with sternal rub or repositioning attempts by therapist, but does not maintain alertness for active participation with session.  Did attempt passive movement to R LE; patient very resistant to any movement attempts (unable to redirect), grimacing and verbalizing "ouch" with any movement of R hip.)  Of note, Dayna BarkerKaren Robertson present at bedside during evaluation attempt.  Reports [facility].  States patient is non-ambulatory at baseline, performing SPT with assist from staff between bed and WC as needed.  Was able to feed self when assisted with set up. Per chart, family choosing to focus more on comfort vs aggressive rehab intervention at this time.  Question family wishes for PT involvement at this level.  Upon brief movement attempts, patient does not appear to tolerate movement exceptionally well (noted by grimacing, "ouch" verbalizations with mild movement), and not likely to make significant functional gains given cognitive status/limited alertness.  Will hold further attempts until family present to discuss/clarify wishes for PT POC.   Lafaye Mcelmurry H. Manson PasseyBrown, PT, DPT, NCS 03/02/17, 10:00 AM [phone]

## 2017-03-03 LAB — BPAM RBC
BLOOD PRODUCT EXPIRATION DATE: 201901182359
ISSUE DATE / TIME: 201901111519
Unit Type and Rh: 600

## 2017-03-03 LAB — CBC
HEMATOCRIT: 26.4 % — AB (ref 35.0–47.0)
Hemoglobin: 8.8 g/dL — ABNORMAL LOW (ref 12.0–16.0)
MCH: 28.8 pg (ref 26.0–34.0)
MCHC: 33.4 g/dL (ref 32.0–36.0)
MCV: 86.4 fL (ref 80.0–100.0)
Platelets: 235 10*3/uL (ref 150–440)
RBC: 3.06 MIL/uL — AB (ref 3.80–5.20)
RDW: 15.1 % — AB (ref 11.5–14.5)
WBC: 11.6 10*3/uL — AB (ref 3.6–11.0)

## 2017-03-03 LAB — TYPE AND SCREEN
ABO/RH(D): A NEG
Antibody Screen: NEGATIVE
Unit division: 0

## 2017-03-03 LAB — BASIC METABOLIC PANEL
ANION GAP: 6 (ref 5–15)
BUN: 21 mg/dL — AB (ref 6–20)
CO2: 28 mmol/L (ref 22–32)
Calcium: 8 mg/dL — ABNORMAL LOW (ref 8.9–10.3)
Chloride: 103 mmol/L (ref 101–111)
Creatinine, Ser: 0.6 mg/dL (ref 0.44–1.00)
GFR calc Af Amer: >60 mL/min (ref >60)
GFR calc non Af Amer: >60 mL/min (ref >60)
GLUCOSE: 118 mg/dL — AB (ref 65–99)
Potassium: 3.7 mmol/L (ref 3.5–5.1)
Sodium: 137 mmol/L (ref 135–145)

## 2017-03-03 MED ORDER — ASPIRIN EC 325 MG PO TBEC: 325.0000 mg | DELAYED_RELEASE_TABLET | Freq: Every day | ORAL | 3 refills | Status: AC

## 2017-03-03 MED ORDER — HYDROCODONE-ACETAMINOPHEN 5-325 MG PO TABS: 1.0000 | ORAL_TABLET | Freq: Four times a day (QID) | ORAL | 0 refills | Status: AC | PRN

## 2017-03-03 NOTE — Progress Notes (Signed)
Subjective:  Patient sitting comfortably in bed. She is unable to answer questions due to advanced dementia.  Objective:   VITALS:   Vitals:   03/02/17 1824 03/02/17 2014 03/03/17 0427 03/03/17 0713  BP: (!) 118/48 (!) 121/41 (!) 101/58 (!) 106/51  Pulse: 79 82 96 80  Resp: 18 18 19 18   Temp: 98.6 F (37 C) (!) 97.4 F (36.3 C) 98.3 F (36.8 C) 97.8 F (36.6 C)  TempSrc: Axillary Axillary Oral Oral  SpO2:  98% 98% 97%  Weight:      Height:        PHYSICAL EXAM:  Dorsiflexion/Plantar flexion intact Incision: dressing C/D/I Compartment soft  LABS  Results for orders placed or performed during the hospital encounter of 02/28/17 (from the past 24 hour(s))  Type and screen     Status: None   Collection Time: 03/02/17 11:12 AM  Result Value Ref Range   ABO/RH(D) A NEG    Antibody Screen NEG    Sample Expiration 03/05/2017    Unit Number [id number]    Blood Component Type RED CELLS,LR    Unit division 00    Status of Unit ISSUED,FINAL    Transfusion Status OK TO TRANSFUSE    Crossmatch Result      Compatible Performed at [facility], [address] [place], [place] [address]   Prepare RBC     Status: None   Collection Time: 03/02/17 11:27 AM  Result Value Ref Range   Order Confirmation      ORDER PROCESSED BY BLOOD BANK Performed at [facility], [address] [place], [place] [address]   CBC     Status: Abnormal   Collection Time: 03/03/17  5:01 AM  Result Value Ref Range   WBC 11.6 (H) 3.6 - 11.0 K/uL   RBC 3.06 (L) 3.80 - 5.20 MIL/uL   Hemoglobin 8.8 (L) 12.0 - 16.0 g/dL   HCT 62.1 (L) 30.8 - 65.7 %   MCV 86.4 80.0 - 100.0 fL   MCH 28.8 26.0 - 34.0 pg   MCHC 33.4 32.0 - 36.0 g/dL   RDW 84.6 (H) 96.2 - 95.2 %   Platelets 235 150 - 440 K/uL  Basic metabolic panel     Status: Abnormal   Collection Time: 03/03/17  5:01 AM  Result Value Ref Range   Sodium 137 135 - 145 mmol/L   Potassium 3.7 3.5 - 5.1 mmol/L   Chloride 103 101  - 111 mmol/L   CO2 28 22 - 32 mmol/L   Glucose, Bld 118 (H) 65 - 99 mg/dL   BUN 21 (H) 6 - 20 mg/dL   Creatinine, Ser 8.41 0.44 - 1.00 mg/dL   Calcium 8.0 (L) 8.9 - 10.3 mg/dL   GFR calc non Af Amer >60 >60 mL/min   GFR calc Af Amer >60 >60 mL/min   Anion gap 6 5 - 15    Dg C-arm 1-60 Min  Result Date: 03/01/2017 CLINICAL DATA:  Right femur ORIF EXAM: DG C-ARM 61-120 MIN; RIGHT FEMUR 2 VIEWS COMPARISON:  1/9/9 FLUOROSCOPY TIME:  Fluoroscopy Time:  1 minute 9 seconds Radiation Exposure Index (if provided by the fluoroscopic device): Not available Number of Acquired Spot Images: 3 FINDINGS: Medullary rod is noted in the proximal right femur. Fixation screws noted traversing the femoral neck. The fracture fragments are in near anatomic alignment. IMPRESSION: ORIF of proximal right femoral fracture. Electronically Signed   By: Alcide Clever M.D.   On: 03/01/2017 12:57  Dg Femur, Min 2 Views Right  Result Date: 03/01/2017 CLINICAL DATA:  Right femur ORIF EXAM: DG C-ARM 61-120 MIN; RIGHT FEMUR 2 VIEWS COMPARISON:  1/9/9 FLUOROSCOPY TIME:  Fluoroscopy Time:  1 minute 9 seconds Radiation Exposure Index (if provided by the fluoroscopic device): Not available Number of Acquired Spot Images: 3 FINDINGS: Medullary rod is noted in the proximal right femur. Fixation screws noted traversing the femoral neck. The fracture fragments are in near anatomic alignment. IMPRESSION: ORIF of proximal right femoral fracture. Electronically Signed   By: Alcide CleverMark  Lukens M.D.   On: 03/01/2017 12:57    Assessment/Plan: 2 Days Post-Op   Active Problems:   Hip fracture (HCC)   Protein-calorie malnutrition, severe  Push PO's Up with therapy [facility]    Continue IV fluids as she is not taking p.o. Well.  Enteric-coated aspirin 1 p.o. twice daily 325 mg for 6 weeks following discharge  Return to office in 2 weeks for staple removal    Lyndle HerrlichJames R Doyl Bitting , MD 03/03/2017, 10:24  AM

## 2017-03-03 NOTE — Discharge Instructions (Signed)
[facility] [place] [facility]  DIET:  Regular diet  DISCHARGE CONDITION:  Stable  ACTIVITY:  Activity as tolerated  OXYGEN:  Home Oxygen: No.   Oxygen Delivery: room air  DISCHARGE LOCATION:  ALF    ADDITIONAL DISCHARGE INSTRUCTION:   If you experience worsening of your admission symptoms, develop shortness of breath, life threatening emergency, suicidal or homicidal thoughts you must seek medical attention immediately by calling 911 or calling your MD immediately  if symptoms less severe.  You Must read complete instructions/literature along with all the possible adverse reactions/side effects for all the Medicines you take and that have been prescribed to you. Take any new Medicines after you have completely understood and accpet all the possible adverse reactions/side effects.   Please note  You were cared for by a hospitalist during your hospital stay. If you have any questions about your discharge medications or the care you received while you were in the hospital after you are discharged, you can call the unit and asked to speak with the hospitalist on call if the hospitalist that took care of you is not available. Once you are discharged, your primary care physician will handle any further medical issues. Please note that NO REFILLS for any discharge medications will be authorized once you are discharged, as it is imperative that you return to your primary care physician (or establish a relationship with a primary care physician if you do not have one) for your aftercare needs so that they can reassess your need for medications and monitor your lab values.

## 2017-03-03 NOTE — Progress Notes (Signed)
PT Cancellation Note  Patient Details Name: Marie PellegriniMary Vega MRN: [id number] DOB: 04/05/1919   Cancelled Treatment:    Reason Eval/Treat Not Completed: Pain limiting ability to participate;Fatigue/lethargy limiting ability to participate.  PT eval attempted with assistance from nurse with pt unable to follow any commands, pt very lethargic with eyes closed with no verbal communication, and with pt crying out with any attempts to move RLE in bed.  PT and nursing agree that pt is not appropriate for skilled PT services at this time and will complete PT orders.  Will reassess pt at a future date pending a change in status upon receipt of new PT orders.     Ovidio Hanger. Scott Erion Hermans PT, DPT 03/03/17, 10:59 AM

## 2017-03-03 NOTE — Progress Notes (Signed)
OT Cancellation Note  Patient Details Name: Marie PellegriniMary Vega MRN: [id number] DOB: 12/15/1919   Cancelled Treatment:    Reason Eval/Treat Not Completed: Pain limiting ability to participate;Fatigue/lethargy limiting ability to participate. Order received, chart reviewed. Spoke with PT regarding recent attempt to evaluate with RN. Per PT, pt unable to follow any commands, pt very lethargic with eyes closed with no verbal communication, and with pt crying out with any attempts to move RLE in bed. PT/RN in agreement that pt is not appropriate for therapy at this time. Will complete OT orders. Will reassess pt at a future date pending a change in status upon receipt of new OT orders.   Richrd PrimeJamie Stiller, MPH, MS, OTR/L ascom [phone] 03/03/17, 11:12 AM

## 2017-03-03 NOTE — [place] FL2 (Signed)
[place] MEDICAID FL2 LEVEL OF CARE SCREENING TOOL     IDENTIFICATION  Patient Name: Marie Vega Birthdate: 05/13/1919 Sex: female Admission Date (Current Location): 02/28/2017  [place]   Facility and Address:  [facility], [address], [place], [place] [address]      Provider Number: [id number]  Attending Physician Name and Address:  Auburn BilberryPatel, Shreyang, MD  Relative Name and Phone Number:       Current Level of Care: Hospital Recommended Level of Care: Assisted Living Facility, Memory Care Prior Approval Number:    Date Approved/Denied:   PASRR Number:    Discharge Plan: Domiciliary (Rest home)(Memory Care )    Current Diagnoses: Patient Active Problem List   Diagnosis Date Noted  . Protein-calorie malnutrition, severe 03/02/2017  . Hip fracture (HCC) 02/28/2017  . Acute encephalopathy 10/13/2015    Orientation RESPIRATION BLADDER Height & Weight     Self  Normal Incontinent Weight: 99 lb (44.9 kg) Height:  5\' 7"  (170.2 cm)  BEHAVIORAL SYMPTOMS/MOOD NEUROLOGICAL BOWEL NUTRITION STATUS      Continent Diet(Diet: DYS 3 )  AMBULATORY STATUS COMMUNICATION OF NEEDS Skin   Extensive Assist Verbally Surgical wounds                       Personal Care Assistance Level of Assistance  Bathing, Feeding, Dressing Bathing Assistance: Limited assistance Feeding assistance: Independent Dressing Assistance: Limited assistance     Functional Limitations Info  Hearing, Sight, Speech Sight Info: Adequate Hearing Info: Adequate Speech Info: Adequate    SPECIAL CARE FACTORS FREQUENCY  (Followed by [facility]. )                    Contractures      Additional Factors Info  Code Status, Allergies Code Status Info: (DNR ) Allergies Info: (No Known Allergies. )           Current Medications (03/03/2017):  This is the current hospital active medication list Current Facility-Administered  Medications  Medication Dose Route Frequency Provider Last Rate Last Dose  . 0.45 % sodium chloride infusion   Intravenous Continuous Deeann SaintMiller, Howard, MD 50 mL/hr at 03/03/17 1011    . acetaminophen (TYLENOL) tablet 650 mg  650 mg Oral Q6H PRN Deeann SaintMiller, Howard, MD       Or  . acetaminophen (TYLENOL) suppository 650 mg  650 mg Rectal Q6H PRN Deeann SaintMiller, Howard, MD      . alum & mag hydroxide-simeth (MAALOX/MYLANTA) 200-200-20 MG/5ML suspension 30 mL  30 mL Oral Q4H PRN Deeann SaintMiller, Howard, MD      . bisacodyl (DULCOLAX) suppository 10 mg  10 mg Rectal Daily PRN Deeann SaintMiller, Howard, MD      . enoxaparin (LOVENOX) injection 30 mg  30 mg Subcutaneous Q24H Deeann SaintMiller, Howard, MD   30 mg at 03/02/17 1059  . feeding supplement (ENSURE ENLIVE) (ENSURE ENLIVE) liquid 237 mL  237 mL Oral TID BM Mody, Sital, MD      . ferrous sulfate tablet 325 mg  325 mg Oral Q breakfast Deeann SaintMiller, Howard, MD      . HYDROcodone-acetaminophen (NORCO/VICODIN) 5-325 MG per tablet 1-2 tablet  1-2 tablet Oral Q6H PRN Deeann SaintMiller, Howard, MD      . magnesium hydroxide (MILK OF MAGNESIA) suspension 30 mL  30 mL Oral Daily PRN Deeann SaintMiller, Howard, MD      . menthol-cetylpyridinium (CEPACOL) lozenge 3 mg  1 lozenge Oral PRN Deeann SaintMiller, Howard, MD  Or  . phenol (CHLORASEPTIC) mouth spray 1 spray  1 spray Mouth/Throat PRN Deeann Saint, MD      . metoCLOPramide (REGLAN) tablet 5-10 mg  5-10 mg Oral Q8H PRN Deeann Saint, MD       Or  . metoCLOPramide (REGLAN) injection 5-10 mg  5-10 mg Intravenous Q8H PRN Deeann Saint, MD      . morphine 2 MG/ML injection 1 mg  1 mg Intravenous Q2H PRN Deeann Saint, MD      . ondansetron [facility]) tablet 4 mg  4 mg Oral Q6H PRN Deeann Saint, MD       Or  . ondansetron [facility]) injection 4 mg  4 mg Intravenous Q6H PRN Deeann Saint, MD      . senna Mancel Parsons) tablet 8.6 mg  1 tablet Oral BID Deeann Saint, MD   8.6 mg at 03/02/17 2228  . sodium phosphate (FLEET) 7-19 GM/118ML enema 1 enema  1 enema Rectal Once PRN Deeann Saint, MD      . zolpidem Remus Loffler) tablet 5 mg  5 mg Oral QHS PRN Deeann Saint, MD         Discharge Medications: Discharge Medications   Allergies as of 03/03/2017   No Known Allergies             Medication List     STOP taking these medications   aspirin 81 MG chewable tablet Replaced by:  aspirin EC 325 MG tablet     TAKE these medications   acetaminophen 500 MG tablet Commonly known as:  TYLENOL Take 500 mg by mouth at bedtime as needed for mild pain.   aspirin EC 325 MG tablet Take 1 tablet (325 mg total) by mouth daily. Replaces:  aspirin 81 MG chewable tablet   busPIRone 5 MG tablet Commonly known as:  BUSPAR Take 5 mg by mouth 2 (two) times daily.   feeding supplement (ENSURE ENLIVE) Liqd Take 237 mLs by mouth 3 (three) times daily between meals.   fluticasone 50 MCG/ACT nasal spray Commonly known as:  FLONASE Place 1 spray into both nostrils daily.   HYDROcodone-acetaminophen 5-325 MG tablet Commonly known as:  NORCO/VICODIN Take 1-2 tablets by mouth every 6 (six) hours as needed for moderate pain.   ibuprofen 400 MG tablet Commonly known as:  ADVIL,MOTRIN Take 400 mg by mouth 2 (two) times daily as needed for mild pain.   Melatonin 3 MG Tabs Take 3 mg by mouth at bedtime.   sertraline 100 MG tablet Commonly known as:  ZOLOFT Take 100 mg by mouth at bedtime.           Relevant Imaging Results:  Relevant Lab Results:   Additional Information (SSN: [id number])  Judi Cong, LCSW

## 2017-03-03 NOTE — Discharge Summary (Signed)
[facility] [place] [facility], [place] y.o., DOB Oct 21, 1919, MRN [id number]. Admission date: 02/28/2017 Discharge Date 03/03/2017 Primary MD Randie Heinz, NP Admitting Physician Ihor Austin, MD  Admission Diagnosis  Closed intertrochanteric fracture of hip, right, initial encounter [facility]) [S72.141A]  Discharge Diagnosis   Active Problems: Right intertrochanteric hip fracture (HCC)   Protein-calorie malnutrition, severe Hypokalemia Depression Dementia       Hospital Course  Patient is a 84 year old female with severe dementia who presented after a fall and was noted to have a right-sided intertrochanteric fracture.  Patient was admitted and had this fixed.  Patient does have advanced dementia.  Patient's family okay with her going back to assisted living facility.  Currently stable to return.             Consults  orthopedic surgery  Significant Tests:  See full reports for all details     Ct Head Wo Contrast  Result Date: 02/28/2017 CLINICAL DATA:  84 year old post unwitnessed fall from bed. Syncope, no other cardiac symptoms EXAM: CT HEAD WITHOUT CONTRAST TECHNIQUE: Contiguous axial images were obtained from the base of the skull through the vertex without intravenous contrast. COMPARISON:  Head CT 01/26/2017 FINDINGS: Brain: Stable degree of atrophy and chronic small vessel ischemia no intracranial hemorrhage, mass effect, or midline shift. No hydrocephalus. The basilar cisterns are patent. No evidence of territorial infarct or acute ischemia. No extra-axial or intracranial fluid collection. Vascular: Atherosclerosis of skullbase vasculature without hyperdense vessel or abnormal calcification. Skull: No fracture or focal lesion. Sinuses/Orbits: Probable bilateral cataract resection. Sclerosis about left side of sphenoid sinus suggests prior paranasal sinus disease, no acute inflammation. Other: None. IMPRESSION: 1.  No acute intracranial  abnormality.  No skull fracture. 2. Unchanged atrophy and chronic small vessel ischemia. Electronically Signed   By: Rubye Oaks M.D.   On: 02/28/2017 04:34   Dg C-arm 1-60 Min  Result Date: 03/01/2017 CLINICAL DATA:  Right femur ORIF EXAM: DG C-ARM 61-120 MIN; RIGHT FEMUR 2 VIEWS COMPARISON:  1/9/9 FLUOROSCOPY TIME:  Fluoroscopy Time:  1 minute 9 seconds Radiation Exposure Index (if provided by the fluoroscopic device): Not available Number of Acquired Spot Images: 3 FINDINGS: Medullary rod is noted in the proximal right femur. Fixation screws noted traversing the femoral neck. The fracture fragments are in near anatomic alignment. IMPRESSION: ORIF of proximal right femoral fracture. Electronically Signed   By: Alcide Clever M.D.   On: 03/01/2017 12:57   Dg Hip Unilat W Or Wo Pelvis 2-3 Views Right  Result Date: 02/28/2017 CLINICAL DATA:  84 year old with right hip pain after fall. EXAM: DG HIP (WITH OR WITHOUT PELVIS) 2-3V RIGHT COMPARISON:  None. FINDINGS: Intertrochanteric fracture is displaced with displacement involving a fragment containing the lesser trochanter spanning 18 mm. Minimal proximal migration of the femoral shaft. Femoral head remains seated. No additional fracture of the bony pelvis. The pubic rami are intact. Bones are under mineralized. There are vascular calcifications. IMPRESSION: Displaced intertrochanteric right hip fracture. Electronically Signed   By: Rubye Oaks M.D.   On: 02/28/2017 04:27   Dg Femur, Min 2 Views Right  Result Date: 03/01/2017 CLINICAL DATA:  Right femur ORIF EXAM: DG C-ARM 61-120 MIN; RIGHT FEMUR 2 VIEWS COMPARISON:  1/9/9 FLUOROSCOPY TIME:  Fluoroscopy Time:  1 minute 9 seconds Radiation Exposure Index (if provided by the fluoroscopic device): Not available Number of Acquired Spot Images: 3 FINDINGS: Medullary rod is noted in the proximal right femur. Fixation screws noted  traversing the femoral neck. The fracture fragments are in near anatomic  alignment. IMPRESSION: ORIF of proximal right femoral fracture. Electronically Signed   By: Alcide Clever M.D.   On: 03/01/2017 12:57       Today   Subjective:   Princella Pellegrini patient doing well has no complaints confused Objective:   Blood pressure (!) 106/51, pulse 80, temperature 97.8 F (36.6 C), temperature source Oral, resp. rate 18, height 5\' 7"  (1.702 m), weight 99 lb (44.9 kg), SpO2 97 %.  .  Intake/Output Summary (Last 24 hours) at 03/03/2017 1341 Last data filed at 03/03/2017 0432 Gross per 24 hour  Intake 120 ml  Output 300 ml  Net -180 ml    Exam VITAL SIGNS: Blood pressure (!) 106/51, pulse 80, temperature 97.8 F (36.6 C), temperature source Oral, resp. rate 18, height 5\' 7"  (1.702 m), weight 99 lb (44.9 kg), SpO2 97 %.  GENERAL:  84 y.o.-year-old patient lying in the bed with no acute distress.  EYES: Pupils equal, round, reactive to light and accommodation. No scleral icterus. Extraocular muscles intact.  HEENT: Head atraumatic, normocephalic. Oropharynx and nasopharynx clear.  NECK:  Supple, no jugular venous distention. No thyroid enlargement, no tenderness.  LUNGS: Normal breath sounds bilaterally, no wheezing, rales,rhonchi or crepitation. No use of accessory muscles of respiration.  CARDIOVASCULAR: S1, S2 normal. No murmurs, rubs, or gallops.  ABDOMEN: Soft, nontender, nondistended. Bowel sounds present. No organomegaly or mass.  EXTREMITIES: No pedal edema, cyanosis, or clubbing.  NEUROLOGIC: Confused  PSYCHIATRIC: Confused  sKIN: No obvious rash, lesion, or ulcer.   Data Review     CBC w Diff:  Lab Results  Component Value Date   WBC 11.6 (H) 03/03/2017   HGB 8.8 (L) 03/03/2017   HCT 26.4 (L) 03/03/2017   PLT 235 03/03/2017   LYMPHOPCT 20 01/18/2016   MONOPCT 8 01/18/2016   EOSPCT 1 01/18/2016   BASOPCT 1 01/18/2016   CMP:  Lab Results  Component Value Date   NA 137 03/03/2017   K 3.7 03/03/2017   CL 103 03/03/2017   CO2 28 03/03/2017    BUN 21 (H) 03/03/2017   CREATININE 0.60 03/03/2017   PROT 6.9 01/18/2016   ALBUMIN 3.7 01/18/2016   BILITOT 0.6 01/18/2016   ALKPHOS 67 01/18/2016   AST 23 01/18/2016   ALT 13 (L) 01/18/2016  .  Micro Results Recent Results (from the past 240 hour(s))  MRSA PCR Screening     Status: None   Collection Time: 02/28/17  7:44 AM  Result Value Ref Range Status   MRSA by PCR NEGATIVE NEGATIVE Final    Comment:        The GeneXpert MRSA Assay (FDA approved for NASAL specimens only), is one component of a comprehensive MRSA colonization surveillance program. It is not intended to diagnose MRSA infection nor to guide or monitor treatment for MRSA infections. Performed at [facility], [address] [place], [place] [address]         Code Status Orders  (From admission, onward)        Start     Ordered   02/28/17 6045  Do not attempt resuscitation (DNR)  Continuous    Question Answer Comment  In the event of cardiac or respiratory ARREST Do not call a "code blue"   In the event of cardiac or respiratory ARREST Do not perform Intubation, CPR, defibrillation or ACLS   In the event of cardiac or respiratory ARREST Use medication  by any route, position, wound care, and other measures to relive pain and suffering. May use oxygen, suction and manual treatment of airway obstruction as needed for comfort.      02/28/17 0518    Code Status History    Date Active Date Inactive Code Status Order ID Comments User Context   10/13/2015 18:55 10/14/2015 21:25 Full Code [id number]  Hower, Cletis Athensavid K, MD ED    Advance Directive Documentation     Most Recent Value  Type of Advance Directive  Out of facility DNR (pink MOST or yellow form)  Pre-existing out of facility DNR order (yellow form or pink MOST form)  Yellow form placed in chart (order not valid for inpatient use)  "MOST" Form in Place?  No data          Follow-up Information    Deeann SaintMiller, Howard, MD. Schedule an  appointment as soon as possible for a visit on 03/19/2017.   Specialty:  Specialist Why:  @ 9:30 am Contact information: [address] [place] [address] [phone]        Randie HeinzMukamana, Anne Marie, NP Follow up in 1 week(s).   Specialty:  Nurse Practitioner Contact information: [address] [place] [phone]           Discharge Medications   Allergies as of 03/03/2017   No Known Allergies     Medication List    STOP taking these medications   aspirin 81 MG chewable tablet Replaced by:  aspirin EC 325 MG tablet     TAKE these medications   acetaminophen 500 MG tablet Commonly known as:  TYLENOL Take 500 mg by mouth at bedtime as needed for mild pain.   aspirin EC 325 MG tablet Take 1 tablet (325 mg total) by mouth daily. Replaces:  aspirin 81 MG chewable tablet   busPIRone 5 MG tablet Commonly known as:  BUSPAR Take 5 mg by mouth 2 (two) times daily.   feeding supplement (ENSURE ENLIVE) Liqd Take 237 mLs by mouth 3 (three) times daily between meals.   fluticasone 50 MCG/ACT nasal spray Commonly known as:  FLONASE Place 1 spray into both nostrils daily.   HYDROcodone-acetaminophen 5-325 MG tablet Commonly known as:  NORCO/VICODIN Take 1-2 tablets by mouth every 6 (six) hours as needed for moderate pain.   ibuprofen 400 MG tablet Commonly known as:  ADVIL,MOTRIN Take 400 mg by mouth 2 (two) times daily as needed for mild pain.   Melatonin 3 MG Tabs Take 3 mg by mouth at bedtime.   sertraline 100 MG tablet Commonly known as:  ZOLOFT Take 100 mg by mouth at bedtime.          Total Time in preparing paper work, data evaluation and todays exam - 35 minutes  Auburn BilberryPATEL, Abdulahad Mederos M.D on 03/03/2017 at 1:41 PM  [facility]   Office  [phone]

## 2017-03-03 NOTE — Clinical Social Work Note (Signed)
The patient will discharge to [facility] via non-emergent EMS today with hospice at home to be resumed through [facility] The facility and family are aware and in agreement, and the packet has been delivered to the chart. The CSW is signing off. Please consult should needs arise.  Argentina PonderKaren Martha Zuma Hust, MSW, Theresia MajorsLCSWA [phone]

## 2017-03-03 NOTE — Progress Notes (Signed)
Patient is being discharged back to [facility] this afternoon. IV removed and NT prepared patient for transport via EMS.

## 2017-03-23 DEATH — deceased

## 2019-08-31 IMAGING — CT CT HEAD W/O CM
5 of 8 series · 15 of 47 positions shown, 16 images · non-contrast
Comparison: Head CT March 11, 2016; cervical spine CT Yiu Chung

CLINICAL DATA: Pain following fall

EXAM:
CT HEAD WITHOUT CONTRAST
CT CERVICAL SPINE WITHOUT CONTRAST
TECHNIQUE: Multidetector CT imaging of the head and cervical spine was
performed following the standard protocol without intravenous
contrast. Multiplanar CT image reconstructions of the cervical spine
were also generated.

[Series 3: head wo · axial · 0.41mm/px · z∈[-54,-4]mm · 2 of 30 slices shown, 3 images]
[im 10/30  brain]
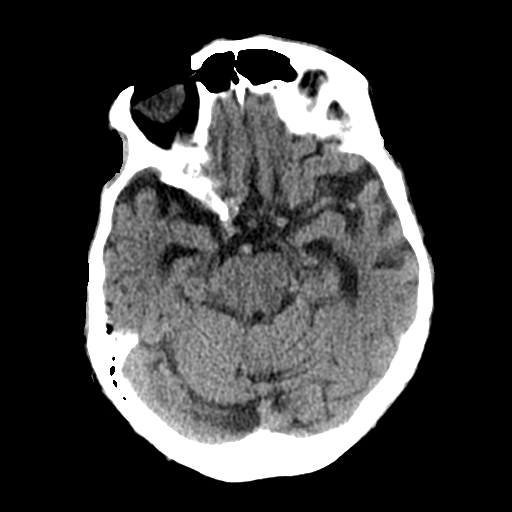
[im 10/30  bone]
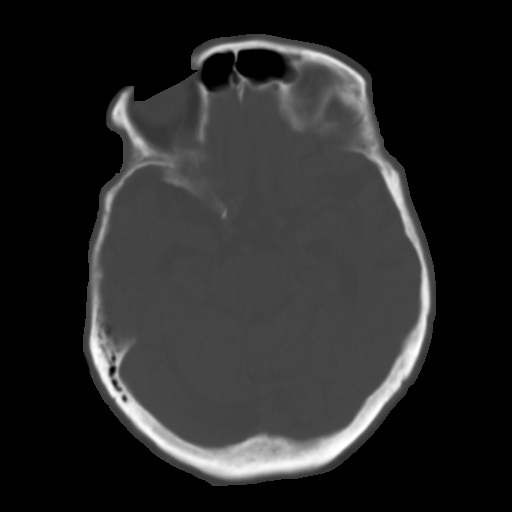
[im 20/30  brain]
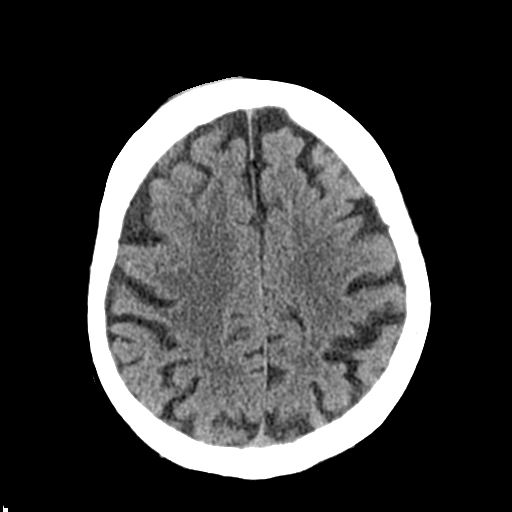

[Series 5: coronal soft tissue · coronal · 0.29mm/px · 3 of 60 slices shown]
[im 12/60  brain]
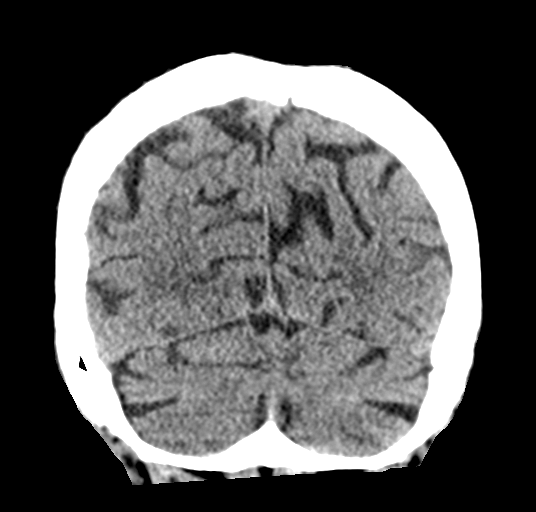
[im 24/60  brain]
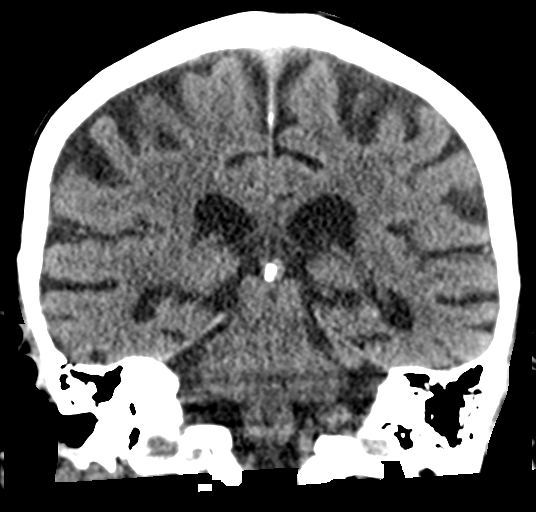
[im 36/60  brain]
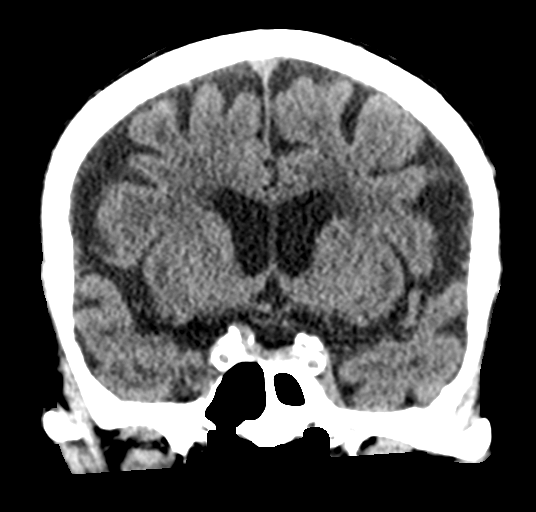

[Series 6: sagittal soft tissue · sagittal · 0.30mm/px · 2 of 47 slices shown]
[im 16/47  brain]
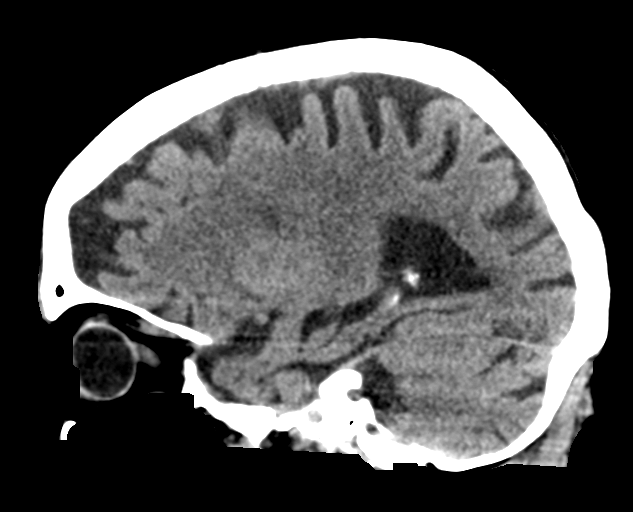
[im 31/47  brain]
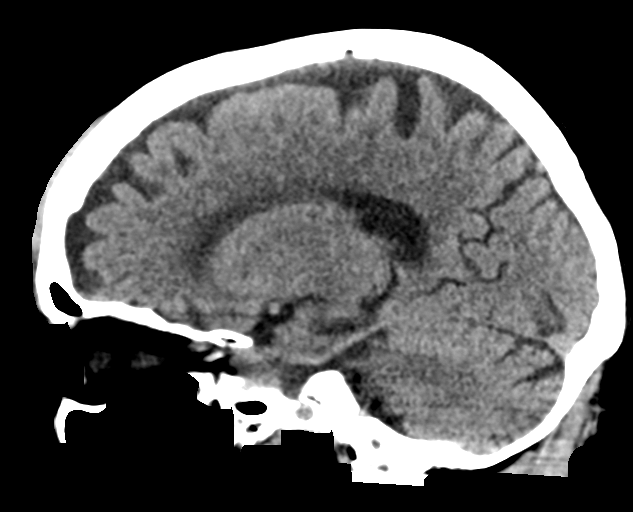

[Series 7: ax head wo · axial · 0.31mm/px · z∈[-59,-15]mm · 2 of 27 slices shown]
[im 9/27  brain]
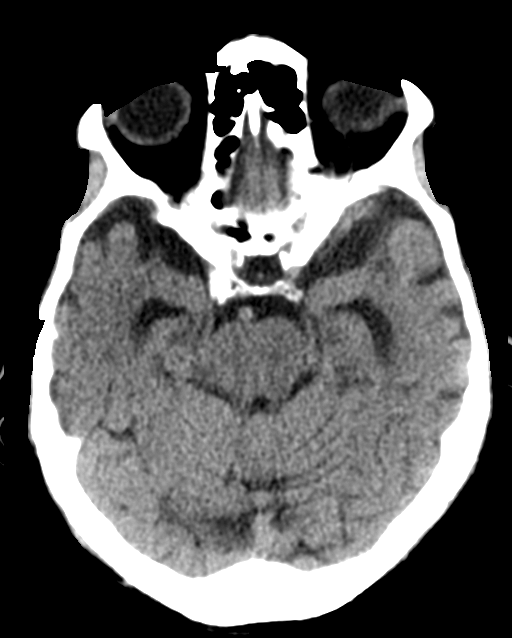
[im 18/27  brain]
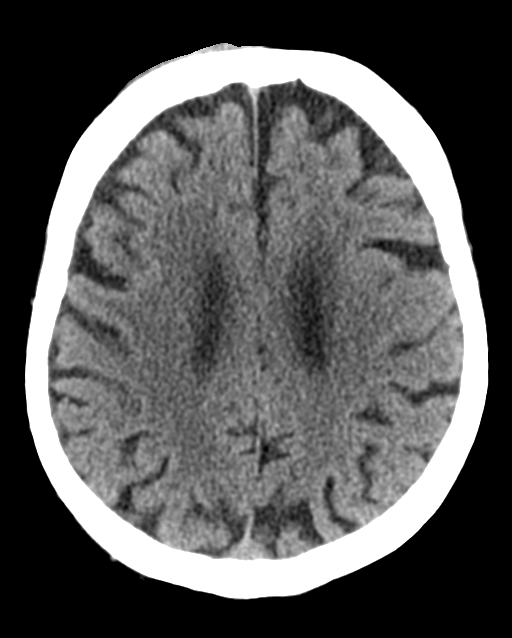

[Series 12: orthogonal bone · axial · 0.26mm/px · z∈[-275,-195]mm · 6 of 87 slices shown]
[im 8/87  bone]
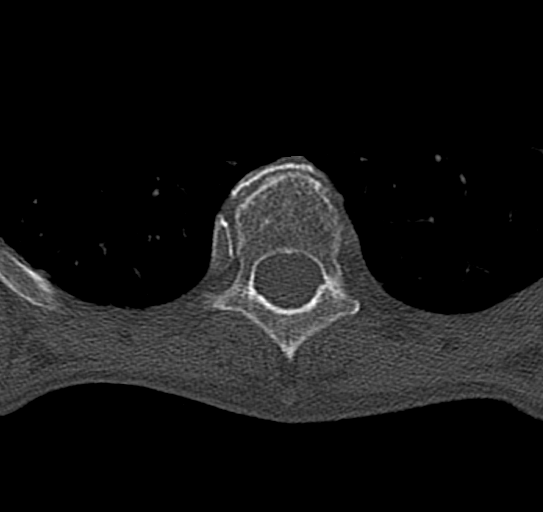
[im 16/87  bone]
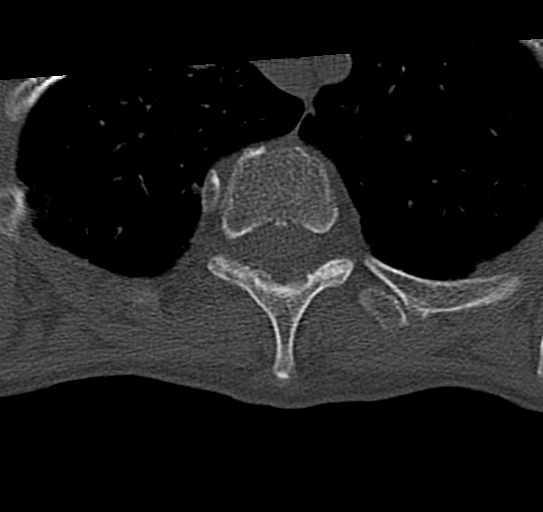
[im 32/87  bone]
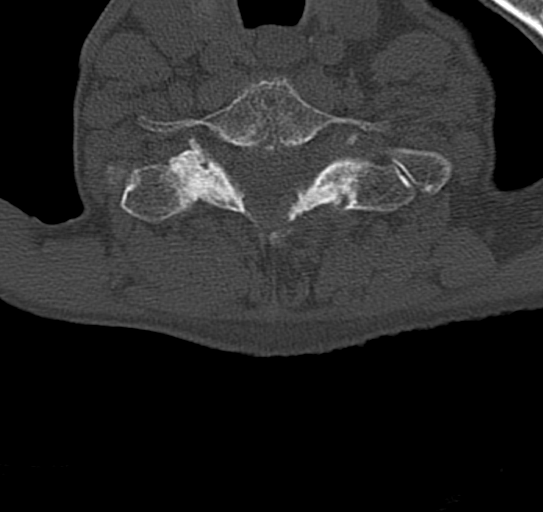
[im 40/87  bone]
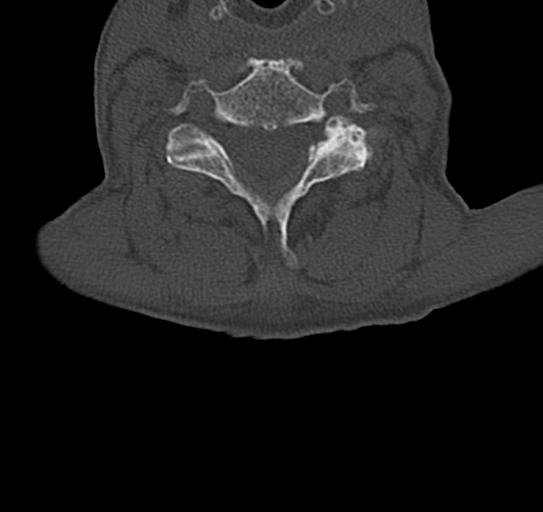
[im 47/87  bone]
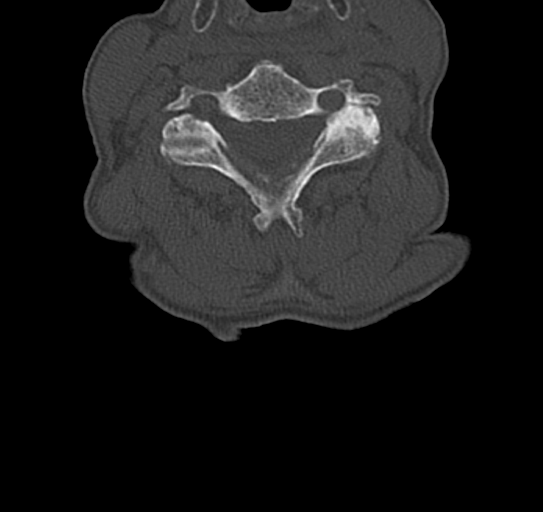
[im 55/87  bone]
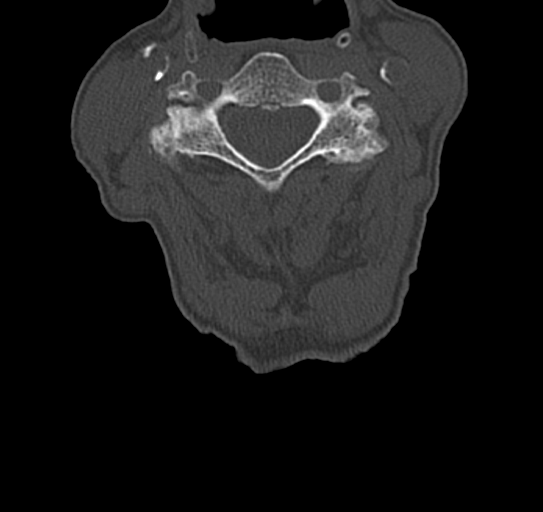

[15 of 47 positions shown; findings below may reference images not displayed]

FINDINGS: CT HEAD FINDINGS

Brain: There is moderate diffuse atrophy. There is no intracranial
mass, hemorrhage, extra-axial fluid collection, or midline shift.
There is patchy small vessel disease in the centra semiovale
bilaterally. Elsewhere gray-white compartments appear normal. No
acute infarct evident.

Vascular: No hyperdense vessel. There is calcification in each
carotid siphon as well as subtly in the distal left vertebral
artery.

Skull: The bony calvarium appears intact. There is a right frontal
scalp hematoma.

Sinuses/Orbits: There is mucosal thickening in multiple ethmoid air
cells. There is opacification of posterior right ethmoid air cell.
There is mucosal thickening in the left sphenoid sinus region. Other
visualized paranasal sinuses elsewhere are clear. Orbits appear
symmetric bilaterally. There are old healed nasal fractures
bilaterally, stable.

Other: Mastoid air cells are clear. There is mild debris in the left
external auditory canal.

CT CERVICAL SPINE FINDINGS

Alignment: There is 2 mm of anterolisthesis of C7 on T1. No other
spondylolisthesis evident.

Skull base and vertebrae: Skull base and craniocervical junction
regions appear normal. There is moderate pannus posterior to the
odontoid, not causing appreciable impression on the craniocervical
junction. There is no appreciable fracture. There are no blastic or
lytic bone lesions.

Soft tissues and spinal canal: Prevertebral soft tissues and
predental space regions are normal. No paraspinous lesions. No cord
or canal hematoma evident.

Disc levels: There is a moderately severe disc space narrowing at
C5-6. There is facet hypertrophy at several levels bilaterally.
There is no frank disc extrusion or stenosis.

Upper chest: There is scarring in the right apex region. No upper
lung zone edema or consolidation.

Other: There is calcification in each carotid artery. There is also
calcification in the distal left vertebral artery.
IMPRESSION: CT head: Atrophy with patchy periventricular small vessel disease.
No intracranial mass, hemorrhage, or acute infarct.

Foci of arterial vascular calcification noted. Areas of paranasal
sinus disease noted. Probable cerumen left external auditory canal.
Old healed fractures of the nasal bone noted.

CT cervical spine: No fracture. Slight spondylolisthesis at C7-T1.
Osteoarthritic change noted at several levels. Calcification in each
carotid artery and distal left vertebral artery.
